# Patient Record
Sex: Male | Born: 1975 | Hispanic: Yes | Marital: Married | Smoking: Never smoker
Health system: Southern US, Community
[De-identification: ages and names within clinical notes are randomized; demographics above are authoritative.]

---

## 2013-09-03 ENCOUNTER — Ambulatory Visit (INDEPENDENT_AMBULATORY_CARE_PROVIDER_SITE_OTHER): Payer: 59 | Admitting: Internal Medicine

## 2013-09-03 ENCOUNTER — Ambulatory Visit: Payer: 59

## 2013-09-03 VITALS — BP 124/90 | HR 80 | Temp 98.1°F | Resp 20 | Ht 68.0 in | Wt 197.0 lb

## 2013-09-03 DIAGNOSIS — R059 Cough, unspecified: Secondary | ICD-10-CM

## 2013-09-03 DIAGNOSIS — R079 Chest pain, unspecified: Secondary | ICD-10-CM

## 2013-09-03 DIAGNOSIS — R05 Cough: Secondary | ICD-10-CM

## 2013-09-03 DIAGNOSIS — J219 Acute bronchiolitis, unspecified: Secondary | ICD-10-CM

## 2013-09-03 DIAGNOSIS — R7309 Other abnormal glucose: Secondary | ICD-10-CM

## 2013-09-03 DIAGNOSIS — J218 Acute bronchiolitis due to other specified organisms: Secondary | ICD-10-CM

## 2013-09-03 DIAGNOSIS — R7302 Impaired glucose tolerance (oral): Secondary | ICD-10-CM

## 2013-09-03 DIAGNOSIS — Z789 Other specified health status: Secondary | ICD-10-CM

## 2013-09-03 LAB — POCT CBC
Granulocyte percent: 53.8 %G (ref 37–80)
HEMATOCRIT: 44.4 % (ref 43.5–53.7)
Hemoglobin: 14.4 g/dL (ref 14.1–18.1)
LYMPH, POC: 1.7 (ref 0.6–3.4)
MCH, POC: 28.5 pg (ref 27–31.2)
MCHC: 32.4 g/dL (ref 31.8–35.4)
MCV: 87.8 fL (ref 80–97)
MID (cbc): 0.9 (ref 0–0.9)
MPV: 9.2 fL (ref 0–99.8)
PLATELET COUNT, POC: 224 10*3/uL (ref 142–424)
POC GRANULOCYTE: 3 (ref 2–6.9)
POC LYMPH %: 30.6 % (ref 10–50)
POC MID %: 15.6 %M — AB (ref 0–12)
RBC: 5.06 M/uL (ref 4.69–6.13)
RDW, POC: 13.4 %
WBC: 5.5 10*3/uL (ref 4.6–10.2)

## 2013-09-03 LAB — POCT GLYCOSYLATED HEMOGLOBIN (HGB A1C): HEMOGLOBIN A1C: 5.6

## 2013-09-03 LAB — GLUCOSE, POCT (MANUAL RESULT ENTRY): POC Glucose: 89 mg/dl (ref 70–99)

## 2013-09-03 MED ORDER — AZITHROMYCIN 500 MG PO TABS: 500.0000 mg | ORAL_TABLET | Freq: Every day | ORAL | Status: DC

## 2013-09-03 MED ORDER — HYDROCODONE-ACETAMINOPHEN 7.5-325 MG/15ML PO SOLN: 5.0000 mL | Freq: Four times a day (QID) | ORAL | Status: DC | PRN

## 2013-09-03 MED ORDER — ALBUTEROL SULFATE HFA 108 (90 BASE) MCG/ACT IN AERS: 2.0000 | INHALATION_SPRAY | Freq: Four times a day (QID) | RESPIRATORY_TRACT | Status: DC | PRN

## 2013-09-03 NOTE — Patient Instructions (Addendum)
Dolor en el pecho (inespecfico) (Chest Pain, Nonspecific) Frecuentemente es difcil dar un diagnstico especfico de la causa de un dolor en el pecho. Siempre existe una posibilidad de que el dolor est relacionado con algo ms grave como un ataque cardaco o un cogulo en los pulmones. Necesitar realizar un seguimiento con el mdico para Catering manager.  CAUSAS  Acidez.  Neumona o bronquitis.  Ansiedad o estrs.  Una inflamacin de la zona que rodea al corazn (pericarditis) o a los pulmones (pleuritis, pleuresa).  Un cogulo sanguneo en el pulmn.  Pulmones colapsados (neumotrax). Puede aparecer de Gus Height repentina por s solo (neumotrax espontneo) o por un traumatismo en el pecho.  Culebrilla (virus del herpes zster). Las paredes del pecho estn compuestas de [place], msculos y Dietitian. Cualquiera de estos puede ser fuente del dolor.  Puede haber una contusin en los huesos debido a una lesin.  Puede haber un esguince en los msculos o el cartlago ocasionado por la tos o un esfuerzo.  El cartlago tambin puede verse afectado por una inflamacin y Engineer, agricultural (costocondritis). DIAGNSTICO Puede ser necesario realizar anlisis de laboratorio u otros estudios tales como radiografas, Materials engineer, prueba de esfuerzo, o diagnstico por imgenes para el corazn para determinar la causa de su dolor.  TRATAMIENTO  El tratamiento depender de la causa que provoque el dolor en el pecho. El tratamiento pueden incluir:  Bloqueadores de cido para la acidez estomacal.  Medicamentos antiinflamatorios.  Analgsicos para las enfermedades inflamatorias.  Antibiticos si existe infeccin.  Podrn aconsejarle que modifique su estilo de vida. Esto incluye dejar de fumar y evitar el alcohol, la cafena y el chocolate.  Se le aconsejar que duerma con la cabeza levantada [place]). Esto reduce la probabilidad de que el cido vuelva hacia atrs desde el estmago  hasta el esfago.  La mayora de las veces, Chief Technology Officer en el pecho no especfico mejora dentro de los 2 a 3 das con reposo y medicamentos para Technical sales engineer. INSTRUCCIONES PARA EL CUIDADO DOMICILIARIO  Si le prescriben antibiticos, tmelos tal como se le indic. Tmelos todos, aunque se sienta mejor.  Durante los [address], evite la actividad fsica que le hace Civil engineer, contracting. Contine con las actividades fsicas tal como se le indic.  No fume.  Evite consumir alcohol.  Slo tome medicamentos de Sales promotion account executive o prescriptos para Primary school teacher, las molestias o bajar la fiebre segn las indicaciones de su mdico.  Siga las indicaciones del profesional que lo asiste para un mayor control si los problemas persisten.  Cumpla con todas las visitas de control programadas. Si no lo hace, podr desarrollar problemas permanentes (crnicos) relacionados con el dolor. Si tiene algn problema para asistir a la cita, debe comunicarse con el establecimiento para obtener asistencia. SOLICITE ATENCIN MDICA SI:  Tiene problemas que considere que pueden ser efectos secundarios de los medicamentos que toma. Lea con cuidado las recomendaciones para su medicacin.  El dolor en el pecho contina incluso despus de haber seguido el tratamiento.  Observa una erupcin en el pecho, que presenta ampollas. SOLICITE ATENCIN MDICA DE INMEDIATO SI:  El dolor en el pecho aumenta o se extiende al brazo, cuello, mandbula, espalda o abdomen.  Le falta el aire, aumenta la tos o tose y [place].  Tiene dolor intenso en la espalda o el abdomen, nuseas o vmitos.  Presenta debilidad, desmayos o escalofros.  Tiene fiebre. ESTO ES UNA EMERGENCIA. No espere a que el dolor se vaya. Pida ayuda mdica de  inmediato. Comunquese con el servicio de urgencias de su localidad (911 en los [place]). No maneje solo [employer]. EST SEGURO QUE:   Comprende las instrucciones para el alta  mdica.  Controlar su enfermedad.  Solicitar atencin mdica de inmediato segn las indicaciones. Document Released: 05/31/2005 Document Revised: 08/23/2011 [facility] Patient Information 2014 [place], [place]. Broncoespasmo - Adulto (Bronchospasm, Adult) Broncoespasmo significa que hay un espasmo o restriccin de las vas areas que llevan el aire a los pulmones. Durante el broncoespasmo, la respiracin se hace ms difcil debido a que las vas respiratorias se contraen. Cuando esto ocurre, puede haber tos, un silbido al respirar (sibilancias) presin en el pecho y dificultad para respirar. Generalmente se asocia al asma, pero no todos los pacientes que experimentan broncoespasmos sufren asma. CAUSAS  La causa del broncoespasmo es la inflamacin o la irritacin de las vas respiratorias. La inflamacin o la irritacin pueden haber sido desencadenadas por:   Environmental consultant (por ejemplo a animales, polen, alimentos y moho). Los alrgenos que causan el broncoespasmo pueden producir sibilancias inmediatamente despus de la exposicin, o varias horas despus.   Infeccin. Se considera que la causa ms frecuente son las infecciones virales.   Ejercicios.   Irritantes (como la polucin, humo de cigarrillos, olores fuertes, Engineer, manufacturing y vapores de [place]).   Los cambios climticos. El viento aumenta la cantidad de moho y polen del aire. La lluvia limpia el aire arrastrando las sustancias irritantes. El aire fro puede causar inflamacin.   Estrs y [employer].  SIGNOS Y SNTOMAS   Sibilancias.   Tos excesiva durante la noche.   Tos frecuente o intensa durante un resfro comn.   Opresin en el pecho.   Falta de aire.  DIAGNSTICO  El broncoespasmo se diagnostica con la historia clnica y un examen fsico. En algunos casos se indican otros estudios, como radiografas de trax, para Risk manager. TRATAMIENTO   Le indicarn medicamentos por va inhalatoria  para abrir las vas respiratorias y ayudarlo a Industrial/product designer. Los medicamentos se administran por medio de Advertising account executive o Warehouse manager.  Le administrarn corticoides en los casos de broncoespasmo grave, generalmente cuando se asocia al asma. INSTRUCCIONES PARA EL CUIDADO EN EL HOGAR   Siempre tenga un plan preparado para pedir asistencia mdica. Sepa cuando debe llamar al mdico y a los servicios de emergencia de su localidad (911 en [place]). Sepa donde puede acceder a un servicio de emergencias.  Tome todos los medicamentos como le indic el mdico.  Si le indicaron el uso de un inhalador o nebulizador, consulte a su mdico para que le explique cmo usarlo correctamente. Siempre use un espaciador con el inhalador, si le indicaron su uso.  Es Copywriter, advertising muy tranquilo durante el ataque. Trate de relajarse y respirar ms lentamente.  Controle el ambiente del hogar del siguiente modo:  Cambie el filtro de la calefaccin y del aire acondicionado al menos una vez al mes.   Limite el uso de hogares o estufas a lea.  No fume y nopermita que fumen en su hogar.   Evite la exposicin a perfumes y fragancias.   Elimine las plagas (como cucarachas, ratones) y sus excrementos.   Elimine las plantas si observa moho en ellas.   Mantenga su casa limpia y [place].   Reemplace las alfombras por pisos de [place], baldosas o vinilo. Las alfombras pueden retener las escamas o pelos de los animales y [employer].   Use almohadas, mantas y cubre colchones antialrgicos.  Lave las sbanas y las mantas todas las semanas con agua caliente y squelas con aire caliente.   Use mantas de [place] o algodn.   Lvese las manos con frecuencia. SOLICITE ATENCIN MDICA SI:   Tiene dolores musculares.   Siente dolor en el pecho.   El esputo cambia de un color claro o blanco a un color amarillo, verde, gris o sanguinolento.   El esputo que elimina es ms espeso.   Tiene  problemas relacionados con el medicamento que recibe. como urticaria, picazn, hinchazn o dificultades respiratorias.  SOLICITE ATENCIN MDICA DE INMEDIATO SI:   Empeoran las sibilancias y la tos, an despus de tomar los medicamentos recetados.   Tiene una creciente dificultad para respirar.   Siente un dolor intenso en el pecho. ASEGRESE DE QUE:   Comprende estas instrucciones.  Controlar su afeccin.  Recibir ayuda de inmediato si no mejora o si empeora. Document Released: 09/07/2007 Document Revised: 01/31/2013 [facility] Patient Information 2014 [place], [place]. Bronquitis (Bronchitis) La bronquitis es una inflamacin de las vas respiratorias que se extienden desde la trquea [employer] pulmones (bronquios). A menudo, la inflamacin produce la formacin de mucosidad, lo que genera tos. Si la inflamacin es grave, puede provocar falta de aire. CAUSAS  Las causas de la bronquitis pueden ser:   Infecciones virales.  Bacterias.  Humo del cigarrillo.  Alrgenos, contaminantes y otros irritantes. SIGNOS Y SNTOMAS  El sntoma ms habitual de la bronquitis es la tos frecuente con mucosidad. Otros sntomas son:  Grant Ruts.  Dolores [employer] cuerpo.  Congestin en el pecho.  Escalofros.  Falta de aire.  Dolor de Advertising copywriter. DIAGNSTICO  La bronquitis en general se diagnostica con la historia clnica y un examen fsico. En algunos casos se indican otros estudios, como radiografas, para Risk manager.  TRATAMIENTO  Tal vez deba evitar el contacto con la causa del problema (por ejemplo, el cigarrillo). En algunos casos es Dentist. Estos pueden ser:  Antibiticos. Tal vez se los receten si la causa de la bronquitis es una bacteria.  Antitusivos. Tal vez se los receten para [employer] sntomas de la tos.  Medicamentos inhalados. Tal vez se los receten para Museum/gallery conservator las vas respiratorias y Research officer, political party respiracin.  Medicamentos  con corticoides. Tal vez se los receten si tiene bronquitis recurrente (crnica). INSTRUCCIONES PARA EL CUIDADO EN EL HOGAR  Descanse lo suficiente.  Beba lquidos en abundancia para mantener la orina de color claro o amarillo plido (excepto que padezca una enfermedad que requiera la restriccin de lquidos). Tome mucho lquido para Restaurant manager, fast food las secreciones y Statistician.  Tome solo medicamentos de venta libre o recetados, segn las indicaciones del mdico.  Tome los antibiticos exclusivamente segn las indicaciones. Finalice la prescripcin completa, aunque se sienta mejor.  Evite el humo de [address], los qumicos irritantes y los vapores fuertes. Estos agentes empeoran la bronquitis. Si es fumador, abandone el hbito. Considere el uso de goma de Theatre manager o la aplicacin de parches en la piel que contengan nicotina para aliviar los sntomas de abstinencia. Si deja de fumar, sus pulmones se curarn ms rpido.  Ponga un humidificador de vapor fro en la habitacin por la noche para humedecer el aire. Puede ayudarlo a aflojar la mucosidad. Cambie el agua del humidificador a diario. Tambin puede abrir el agua caliente de la ducha y sentarse en el bao con la puerta cerrada durante 5a42minutos.  Concurra a las consultas de control con su mdico segn las indicaciones.  Lvese  las manos con frecuencia para evitar contagiarse bronquitis nuevamente y para no extender la infeccin a Economistotras personas. SOLICITE ATENCIN MDICA SI: Los sntomas no mejoran despus de 1 semana de tratamiento.  SOLICITE ATENCIN MDICA DE INMEDIATO SI:  La fiebre aumenta.  Tiene escalofros.  Siente dolor en el pecho.  Le empeora la falta el aire.  La flema tiene Liberalsangre.  Se desmaya.  Tiene vahdos.  Sufre un dolor intenso de [place].  Vomita repetidas veces. ASEGRESE DE QUE:   Comprende estas instrucciones.  Controlar su afeccin.  Recibir ayuda de inmediato si no mejora o si  empeora. Document Released: 05/31/2005 Document Revised: 03/21/2013 [facility] Patient Information 2014 [place].

## 2013-09-03 NOTE — Progress Notes (Signed)
   Subjective:    Patient ID: Tommy PoreGregorio Rosario, male    DOB: Nov 07, 1975, 38 y.o.   MRN: [id number]  HPI Patient presents today with cough that started yesterday. His cough has been so strong it makes his chest hurt and the pain radiates down his left arm. He states he also feels the pain in his back on the left side. He has not coughed up any mucus. He has fatigue and SOB. His sinuses seem fine. He has sore throat and he hears wheezing when he lays down.   He is on metformin for glucose intolerance. Chest pain not assoc. With syncope, palpitations, or diaphoresis. No hx of asthma but he hears noises in his chest. No hx of smoking. Review of Systems     Objective:   Physical Exam  Constitutional: He is oriented to person, place, and time. He appears well-developed and well-nourished. No distress.  HENT:  Head: Normocephalic.  Right Ear: External ear normal.  Left Ear: External ear normal.  Nose: Mucosal edema, rhinorrhea and sinus tenderness present.  Mouth/Throat: Oropharynx is clear and moist.  Eyes: EOM are normal. No scleral icterus.  Neck: Normal range of motion. Neck supple. No thyromegaly present.  Cardiovascular: Normal rate, regular rhythm and normal heart sounds.   Pulmonary/Chest: Effort normal. He has wheezes. He has rhonchi. He exhibits tenderness.  Lymphadenopathy:    He has no cervical adenopathy.  Neurological: He is alert and oriented to person, place, and time. He exhibits normal muscle tone. Coordination normal.  Psychiatric: He has a normal mood and affect.   EKG normal  [facility] reading (PRIMARY) by  Dr.Hosanna Betley. Increased markings, no def. infiltrate  Results for orders placed in visit on 09/03/13  GLUCOSE, POCT (MANUAL RESULT ENTRY)      Result Value Ref Range   POC Glucose 89  70 - 99 mg/dl  POCT CBC      Result Value Ref Range   WBC 5.5  4.6 - 10.2 K/uL   Lymph, poc 1.7  0.6 - 3.4   POC LYMPH PERCENT 30.6  10 - 50 %L   MID (cbc) 0.9  0 - 0.9   POC MID %  15.6 (*) 0 - 12 %M   POC Granulocyte 3.0  2 - 6.9   Granulocyte percent 53.8  37 - 80 %G   RBC 5.06  4.69 - 6.13 M/uL   Hemoglobin 14.4  14.1 - 18.1 g/dL   HCT, POC 62.144.4  30.843.5 - 53.7 %   MCV 87.8  80 - 97 fL   MCH, POC 28.5  27 - 31.2 pg   MCHC 32.4  31.8 - 35.4 g/dL   RDW, POC 65.713.4     Platelet Count, POC 224  142 - 424 K/uL   MPV 9.2  0 - 99.8 fL  POCT GLYCOSYLATED HEMOGLOBIN (HGB A1C)      Result Value Ref Range   Hemoglobin A1C 5.6            Assessment & Plan:  Chest pain/Cough/Bronchospasm NIDDM controlled Zithromax 500mg /Albuterol inhaler/Lortab elixir

## 2015-08-30 ENCOUNTER — Ambulatory Visit (INDEPENDENT_AMBULATORY_CARE_PROVIDER_SITE_OTHER): Payer: 59 | Admitting: Family Medicine

## 2015-08-30 ENCOUNTER — Ambulatory Visit (INDEPENDENT_AMBULATORY_CARE_PROVIDER_SITE_OTHER): Payer: 59

## 2015-08-30 VITALS — BP 140/82 | HR 75 | Temp 98.4°F | Resp 20 | Ht 68.5 in | Wt 210.0 lb

## 2015-08-30 DIAGNOSIS — M545 Low back pain: Secondary | ICD-10-CM

## 2015-08-30 DIAGNOSIS — N50812 Left testicular pain: Secondary | ICD-10-CM

## 2015-08-30 DIAGNOSIS — E119 Type 2 diabetes mellitus without complications: Secondary | ICD-10-CM

## 2015-08-30 DIAGNOSIS — K219 Gastro-esophageal reflux disease without esophagitis: Secondary | ICD-10-CM | POA: Diagnosis not present

## 2015-08-30 DIAGNOSIS — Z789 Other specified health status: Secondary | ICD-10-CM

## 2015-08-30 LAB — POCT URINALYSIS DIP (MANUAL ENTRY)
BILIRUBIN UA: NEGATIVE
BILIRUBIN UA: NEGATIVE
GLUCOSE UA: NEGATIVE
Leukocytes, UA: NEGATIVE
NITRITE UA: NEGATIVE
PH UA: 6.5
Protein Ur, POC: NEGATIVE
RBC UA: NEGATIVE
SPEC GRAV UA: 1.02
Urobilinogen, UA: 0.2

## 2015-08-30 LAB — POC MICROSCOPIC URINALYSIS (UMFC): Mucus: ABSENT

## 2015-08-30 LAB — POCT GLYCOSYLATED HEMOGLOBIN (HGB A1C): HEMOGLOBIN A1C: 6

## 2015-08-30 LAB — GLUCOSE, POCT (MANUAL RESULT ENTRY): POC GLUCOSE: 99 mg/dL (ref 70–99)

## 2015-08-30 MED ORDER — CIPROFLOXACIN HCL 500 MG PO TABS: 500.0000 mg | ORAL_TABLET | Freq: Two times a day (BID) | ORAL | Status: DC

## 2015-08-30 MED ORDER — CYCLOBENZAPRINE HCL 5 MG PO TABS: ORAL_TABLET | ORAL | Status: DC

## 2015-08-30 NOTE — Progress Notes (Addendum)
Subjective:    Patient ID: Tommy Rosario, male    DOB: Feb 25, 1976, 40 y.o.   MRN: [id number] By signing my name below, I, Javier Docker, attest that this documentation has been prepared under the direction and in the presence of Meredith Staggers, MD. Electronically Signed: Javier Docker, ER Scribe. 08/30/2015. 11:29 AM.  Chief Complaint  Patient presents with  . Testicle Pain    had cyst testicle 6 year ago, feels pain left side x 2 week   . Back Pain    lower back side x 2 week  . Other    sugar level check out  . Gastroesophageal Reflux    HPI HPI Comments: Tommy Rosario is a 40 y.o. male who presents to [facility] reporting for multiple concerns. He usually exercises every night, but has not been able to exercise recently due to his back pain.   Testicle pain: Left sided testicle pain for the last two weeks. Reports having had cysts on the testicles six years ago. He denies dysuria or penile discharge. He was not hit or kicked, the pain started spontaneously. He has no new sexual partners. Right testicle is normal. He has not seen a specialist for his testicular sx.   Low back pain:  Left low back pain for the last two weeks that radiates down his left side. He has no past hx of kidney stones. He works in a Clinical cytogeneticist in   GERD: He has had some heart burn for the last six months. When he eats greasy foods he has heart burn. He has never used a medication for his sx.   DM: He has taken medications for diabetes in the past. He lost weight and has not had to take medications.   Lab Results  Component Value Date   HGBA1C 5.6 09/03/2013   Wt Readings from Last 3 Encounters:  08/30/15 210 lb (95.255 kg)  09/03/13 197 lb (89.359 kg)    There are no active problems to display for this patient.  No past medical history on file. No past surgical history on file. No Known Allergies Prior to Admission medications   Not on File   Social History   Social  History  . Marital Status: Married    Spouse Name: N/A  . Number of Children: N/A  . Years of Education: N/A   Occupational History  . Not on file.   Social History Main Topics  . Smoking status: Never Smoker   . Smokeless tobacco: Not on file  . Alcohol Use: No  . Drug Use: No  . Sexual Activity: Not on file   Other Topics Concern  . Not on file   Social History Narrative    Review of Systems  Constitutional: Negative for fever and chills.  Genitourinary: Negative for dysuria and discharge.  Musculoskeletal: Positive for back pain. Negative for gait problem.      Objective:  BP 140/82 mmHg  Pulse 75  Temp(Src) 98.4 F (36.9 C) (Oral)  Resp 20  Ht 5' 8.5" (1.74 m)  Wt 210 lb (95.255 kg)  BMI 31.46 kg/m2  SpO2 97%  Physical Exam  Constitutional: He is oriented to person, place, and time. He appears well-developed and well-nourished. No distress.  HENT:  Head: Normocephalic and atraumatic.  Eyes: Pupils are equal, round, and reactive to light.  Neck: Neck supple.  Cardiovascular: Normal rate, regular rhythm and normal heart sounds.   No murmur heard. Pulmonary/Chest: Effort normal and breath sounds normal.  No respiratory distress. He has no wheezes.  Abdominal:  No CVA tenderness.   Genitourinary:  He has some tenderness in the front of the left testicle. No hernia. No discharge from penis. Not circumcised.   Musculoskeletal: Normal range of motion. He exhibits tenderness.  Seated straight leg raise normal. Strength normal in lower extremities. Some tenderness along the left paraspinal muscles.   Neurological: He is alert and oriented to person, place, and time. Coordination normal.  Negative babinski. Reflexes 2+ in patella and achilles.   Skin: Skin is warm and dry. He is not diaphoretic.  Psychiatric: He has a normal mood and affect. His behavior is normal.  Nursing note and vitals reviewed.   Dg Abd 1 View  08/30/2015  CLINICAL DATA:  Abdominal pain x2  weeks EXAM: ABDOMEN - 1 VIEW COMPARISON:  None. FINDINGS: Normal bowel gas pattern. No abnormal abdominal calcifications. Regional bones unremarkable. IMPRESSION: Negative. Electronically Signed   By: Corlis Leak M.D.   On: 08/30/2015 12:20   Results for orders placed or performed in visit on 08/30/15  POCT glucose (manual entry)  Result Value Ref Range   POC Glucose 99 70 - 99 mg/dl  POCT glycosylated hemoglobin (Hb A1C)  Result Value Ref Range   Hemoglobin A1C 6.0   POCT urinalysis dipstick  Result Value Ref Range   Color, UA yellow yellow   Clarity, UA clear clear   Glucose, UA negative negative   Bilirubin, UA negative negative   Ketones, POC UA negative negative   Spec Grav, UA 1.020    Blood, UA negative negative   pH, UA 6.5    Protein Ur, POC negative negative   Urobilinogen, UA 0.2    Nitrite, UA Negative Negative   Leukocytes, UA Negative Negative  POCT Microscopic Urinalysis ([facility])  Result Value Ref Range   WBC,UR,HPF,POC None None WBC/hpf   RBC,UR,HPF,POC None None RBC/hpf   Bacteria None None, Too numerous to count   Mucus Absent Absent   Epithelial Cells, UR Per Microscopy None None, Too numerous to count cells/hpf       Assessment & Plan:   Tommy Rosario is a 40 y.o. male Left low back pain, with sciatica presence unspecified - Plan: DG Abd 1 View, cyclobenzaprine (FLEXERIL) 5 MG tablet  -Mechanical/degenerative disc disease possible. No red flags on exam/history. Imaging deferred. Trial of NSAID, range of motion, and Flexeril if needed. Side effects discussed. Return to clinic if not improving as listed patient instructions. Sooner if worse  Left testicular pain - Plan: US Scrotum, POCT urinalysis dipstick, POCT Microscopic Urinalysis ([facility]), DG Abd 1 View, ciprofloxacin (CIPRO) 500 MG tablet  -Reported history of cyst, but suspicious for epididymoorchitis. Start Cipro 500 mg twice a day, check scrotal ultrasound, and if symptomatic cyst, will plan on  referring to urology for evaluation. RTC precautions if worsening. Tendon risks discussed with Cipro.  Type 2 diabetes, diet controlled (HCC) - Plan: POCT glucose (manual entry), POCT glycosylated hemoglobin (Hb A1C)  - Continue to watch diet, exercise, ideal goal of A1c less than 5.7.  Gastroesophageal reflux disease, esophagitis presence not specified  -Trigger avoidance discussed, then Zantac or Pepcid if possible. If not improved with these, can try over-the-counter omeprazole, but discussed side effects and long-term risks.  Language barrier  -Spanish spoken, understanding expressed.  Meds ordered this encounter  Medications  . ciprofloxacin (CIPRO) 500 MG tablet    Sig: Take 1 tablet (500 mg total) by mouth 2 (two) times daily.  Dispense:  20 tablet    Refill:  0  . cyclobenzaprine (FLEXERIL) 5 MG tablet    Sig: 1 pill by mouth up to every 8 hours as needed. Start with one pill by mouth each bedtime as needed due to sedation    Dispense:  15 tablet    Refill:  0   Patient Instructions       IF you received an x-ray today, you will receive an invoice from [facility]. Please contact [facility] at [phone] with questions or concerns regarding your invoice.   IF you received labwork today, you will receive an invoice from [employer]. Please contact [facility] at [phone] with questions or concerns regarding your invoice.   Our billing staff will not be able to assist you with questions regarding bills from these companies.  You will be contacted with the lab results as soon as they are available. The fastest way to get your results is to activate your My Chart account. Instructions are located on the last page of this paperwork. If you have not heard from [place] regarding the results in 2 weeks, please contact this office.    cipro por posible infeccion en testiculo. voy a llamarle de informacion de ultrasonografia.  Tylenol o  motrin por su espalda y si necesario - flexeril en la noche.  regrese en una seman si no esta mejor. Mas temprano si empeorse.   Zantac si necesario oor acido,  pero primero  No come comida que causa simptomas: Dolor de Armed forces technical officer (Back Pain, Adult) El dolor de espalda es muy frecuente en los adultos.La causa del dolor de espalda es rara vez peligrosa y Chief Technology Officer a menudo mejora con el [place].Es posible que se desconozca la causa de esta afeccin. Algunas causas comunes son las siguientes:  Distensin de los msculos o ligamentos que sostienen la columna vertebral.  Chiropractor (degeneracin) de los discos vertebrales.  Artritis.  Lesiones directas en la espalda. En Yahoo, el dolor de espalda es recurrente. Como rara vez es peligroso, las personas pueden aprender a Psychologist, clinical afeccin por s mismas. INSTRUCCIONES PARA EL CUIDADO EN EL HOGAR Controle su dolor de espalda a fin de Public house manager cambio. Las siguientes indicaciones ayudarn a Architectural technologist que pueda sentir:  Medical illustrator. Si permanece sentado o de pie en un mismo lugar durante mucho tiempo, se tensiona la espalda. No se siente, conduzca o permanezca de pie en un mismo lugar durante ms de 30 minutos seguidos. Realice caminatas cortas en superficies planas tan pronto como le sea posible.Trate de caminar un poco ms de Pharmacist, community.  Haga ejercicio regularmente como se lo haya indicado el mdico. El ejercicio ayuda a que su espalda se cure ms rpidamente. Tambin ayuda a prevenir futuras lesiones al [employer] fuertes y flexibles.  No permanezca en la cama.Si hace reposo ms de 1 a 2 das, puede demorar su recuperacin.  Preste atencin a su cuerpo al inclinarse y levantarse. Las posiciones ms cmodas son las que ejercen menos tensin en la espalda en recuperacin. Siempre use tcnicas apropiadas para levantar objetos, como por ejemplo:  Flexionar las rodillas.  Mantener la  carga cerca del cuerpo.  No torcerse.  Encuentre una posicin cmoda para dormir. Use un colchn firme y recustese de costado con las rodillas ligeramente flexionadas. Si se recuesta Fisher [employer], coloque una almohada debajo de las rodillas.  Evite sentir ansiedad o estrs.El estrs aumenta la tensin muscular y  puede empeorar el dolor de espalda.Es importante reconocer si se siente ansioso o estresado y aprender maneras de controlarlo, por ejemplo haciendo ejercicio.  Tome los medicamentos solamente como se lo haya indicado el mdico. Los medicamentos de venta libre para Engineer, materials y la inflamacin a menudo son los ms eficaces.El mdico puede recetarle relajantes musculares.Estos medicamentos ayudan a Primary school teacher de modo que pueda reanudar ms rpidamente sus actividades normales y el ejercicio saludable.  Aplique hielo sobre la zona lesionada.  Ponga el hielo en una bolsa plstica.  Coloque una toalla entre la piel y la bolsa de hielo.  Deje el hielo durante , 2 a 3veces por da, durante los primeros 2 o 3das. Despus de eso, puede alternar el hielo y el calor para reducir Chief Technology Officer y los espasmos.  Mantenga un peso saludable. El exceso de peso ejerce presin adicional sobre la espalda y hace que resulte difcil mantener una buena [place]. SOLICITE ATENCIN MDICA SI:  Siente un dolor que no se alivia con reposo o medicamentos.  Siente mucho dolor que se extiende a las piernas o los glteos.  El dolor no mejora en una semana.  Siente dolor por la noche.  Pierde peso.  Siente escalofros o fiebre. SOLICITE ATENCIN MDICA DE INMEDIATO SI:   Tiene nuevos problemas para controlar la vejiga o los intestinos.  Siente debilidad o adormecimiento inusuales en los brazos o en las piernas.  Siente nuseas o vmitos.  Siente dolor abdominal.  Siente que va a desmayarse.   Esta informacin no tiene Theme park manager el consejo del mdico. Asegrese  de hacerle al mdico cualquier pregunta que tenga.   Document Released: 05/31/2005 Document Revised: 06/21/2014 Elsevier Interactive Patient Education 2016 [employer].   Epididimitis (Epididymitis) La epididimitis es la hinchazn (inflamacin) del epiddimo. El epiddimo es [place] estructura similar a una cuerda que se encuentra a lo largo de la regin posterosuperior del testculo. Recolecta y almacena el semen del testculo. Esta afeccin tambin puede causar dolor e hinchazn en el testculo y el escroto. Generalmente, los sntomas comienzan de [place] repentina (epididimitis [place]). A veces, la epididimitis empieza de manera gradual y dura algn tiempo (epididimitis crnica). Este tipo puede ser ms difcil de tratar. CAUSAS En los hombres menores de 35aos, esta afeccin suele deberse a una infeccin bacteriana o a una enfermedad de transmisin sexual (ETS), por ejemplo:  Gonorrea.  Clamidia.  En los hombres L[employer]3 Communications de 35aos que no tienen sexo anal, la causa de esta afeccin suelen ser las bacterias de una obstruccin o las anomalas en el aparato urinario. Estas pueden ser consecuencia de lo siguiente:  Winferd Humphrey sonda en la vejiga (sonda urinaria).  Tener la prstata agrandada o inflamada.  Haberse sometido recientemente a [place] de las vas [place]. En los hombres que padecen una enfermedad que debilita el sistema de defensa (sistema inmunitario) del organismo, como el VIH, las causas de esta afeccin pueden ser las siguientes:   Otras bacterias, incluidas la tuberculosis y la sfilis.  Virus.  Hongos. En ocasiones, esta afeccin se presenta sin infeccin. Eso puede ocurrir si la orina retrocede al epiddimo despus de haber levantado un objeto pesado o de haber hecho un esfuerzo. FACTORES DE RIESGO Es ms probable que esta afeccin se manifieste en los hombres que:  Han tenido relaciones sexuales sin proteccin con ms de Medical laboratory scientific officer.  Tienen sexo anal.   Se  sometieron recientemente a [place].   Tienen una sonda urinaria.  Tienen problemas urinarios.  Estn inmunodeprimidos. SNTOMAS  Generalmente, esta afeccin se manifiesta sbitamente con escalofros, fiebre, dolor detrs del escroto y en el testculo. Otros sntomas pueden ser los siguientes:   Hinchazn del escroto, el testculo o ambos.  Dolor al eyacular o al Beatrix Shipper.  Dolor en la espalda o el vientre.  Nuseas.  Picazn y secrecin por el pene.  Necesidad frecuente de Geographical information systems officer.  Enrojecimiento y Engineer, mining a Radiation protection practitioner. DIAGNSTICO El mdico puede diagnosticar esta afeccin en funcin de los sntomas y la historia clnica. El mdico Location manager un examen fsico para hacerle preguntas sobre los sntomas y revisarle tanto el escroto como los testculos a fin de Engineer, manufacturing signos de hinchazn, dolor y enrojecimiento. Tambin pueden hacerle otros estudios, por ejemplo:   Anlisis de la secrecin que emana del pene.  Anlisis de orina para detectar infecciones, como las ETS.  El mdico puede hacerle pruebas de deteccin de otras enfermedades de transmisin sexual (ETS), incluido el VIH. TRATAMIENTO El tratamiento de esta afeccin depende de la causa. Si la afeccin se debe a una infeccin bacteriana, pueden recetarle antibiticos por va oral. Si la infeccin bacteriana se ha diseminado a la [place], tal vez deba recibir antibiticos por va intravenosa. El tratamiento de la epididimitis no bacteriana es el cuidado en el hogar, que incluye reposo en cama y elevacin del escroto. Es posible que necesite una ciruga para tratar lo siguiente:  La epididimitis bacteriana que produce la acumulacin de pus en el escroto (absceso).  La epididimitis crnica que no ha respondido a otros tratamientos. INSTRUCCIONES PARA EL CUIDADO EN EL HOGAR Medicamentos  [employer] de venta libre y los recetados solamente como se lo haya indicado el mdico.   Si  le recetaron un antibitico, tmelo como se lo haya indicado el mdico. No deje de tomar o usar el antibitico aunque la afeccin mejore. Actividad sexual  Si la causa de la epididimitis es una ETS, evite la actividad sexual hasta haber completado el [place].  Si el resultado del estudio de una ETS fue positivo, informe a sus Chief Strategy Officer. Tal vez deban recibir tratamiento.No tenga actividad sexual con sus parejas sexuales hasta haber completado el tratamiento. Instrucciones generales  Reanude sus actividades normales como se lo haya indicado el mdico. Pregntele al mdico qu actividades son seguras para usted.  Mantenga el escroto elevado y bien sujeto mientras hace reposo. Pregntele al mdico si debe usar un dispositivo de sujecin del escroto, como un suspensorio. selo como se lo haya indicado el mdico.  Si se lo indican, aplique hielo en la zona afectada:   Ponga el hielo en una bolsa plstica.  Coloque una toalla entre la piel y la bolsa de hielo.  Coloque el hielo durante , 2 a 3veces por Futures trader.  Intente darse un bao de asiento para [employer]. Se trata de un bao de agua tibia que se toma mientras se est sentado. El agua solo debe Adult nurse las caderas y cubrir las nalgas. Hgalo 3 o 4veces al da o como se lo haya indicado el mdico.  Concurra a todas las visitas de control como se lo haya indicado el mdico. Esto es importante. SOLICITE ATENCIN MDICA SI:   Lance Muss.   El medicamento no IT trainer.   El dolor [place].   Los sntomas no mejoran en el trmino de [employer].   Esta informacin no tiene Theme park manager el consejo del mdico. Asegrese de hacerle al mdico cualquier pregunta  que tenga.   Document Released: 05/31/2005 Document Revised: 02/19/2015 Elsevier Interactive Patient Education 2016 ArvinMeritorElsevier Inc.    Opciones de alimentos para pacientes con reflujo gastroesofgico - Adultos (Food Choices  for Gastroesophageal Reflux Disease, Adult) Cuando se tiene reflujo gastroesofgico (ERGE), los alimentos que se ingieren y los hbitos de alimentacin son Engineer, productionmuy importantes. Elegir los alimentos adecuados puede ayudar a Paramedicaliviar las molestias ocasionadas por el [place]onERGE. QU PAUTAS GENERALES DEBO SEGUIR?  Elija las frutas, los vegetales, los cereales integrales, los productos lcteos, la carne de [place], de pescado y de ave con bajo contenido de grasas.  Limite las grasas, [address] Lanecomo los Burnsaceites, los aderezos para [place]anc[place]ada, la [place], los frutos secos y Programme researcher, broadcasting/film/videoel aguacate.  Lleve un registro de las comidas para identificar los alimentos que ocasionan sntomas.  Evite los alimentos que le ocasionen reflujo. Pueden ser distintos para cada persona.  Haga comidas pequeas con frecuencia en lugar de tres comidas OfficeMax Incorporatedabundantes todos los das.  Coma lentamente, en un clima distendido.  Limite el consumo de alimentos fritos.  Cocine los alimentos utilizando mtodos que no sean la fritura.  Evite el consumo alcohol.  Evite beber grandes cantidades de lquidos con las comidas.  Evite agacharse o recostarse hasta despus de 2 o 3horas de haber comido. QU ALIMENTOS NO SE RECOMIENDAN? Los siguientes son algunos alimentos y bebidas que pueden empeorar los sntomas: Veterinary surgeonVegetales Tomates. Jugo de tomate. Salsa de tomate y espagueti. Ajes. [place] y Ridge Farmajo. Rbano picante. Frutas Naranjas, pomelos y limn (fruta y [place]jugo). Carnes Carnes de [place], de pescado y de ave con gran contenido de grasas. Esto incluye los perros calientes, las Groesbeckcostillas, el Victorjamn, la salchicha, el salame y el tocino. Lcteos Leche entera y Wedgewoodleche chocolatada. PPG IndustriesCrema cida. Crema. Mantequilla. Helados. Queso crema.  Bebidas Caf y t negro, con o sin cafena Bebidas gaseosas o energizantes. Condimentos Salsa picante. Salsa barbacoa.  Dulces/postres Chocolate y cacao. Rosquillas. Menta y mentol. Grasas y Massachusetts Mutual Lifeaceites Alimentos con alto contenido de  grasas, incluidas las papas fritas. Otros Vinagre. Especias picantes, como la Brink's Companypimienta negra, la pimienta blanca, la pimienta roja, la pimienta de cayena, el curry en Inglesidepolvo, los clavos de [place]olor, el jengibre y el [place] en polvo. Los artculos mencionados arriba pueden no ser Raytheonuna lista completa de las bebidas y los alimentos que se Theatre stage managerdeben evitar. Comunquese con el nutricionista para recibir ms informacin.   Esta informacin no tiene Theme park managercomo fin reemplazar el consejo del mdico. Asegrese de hacerle al mdico cualquier pregunta que tenga.   Document Released: 03/10/2005 Document Revised: 06/21/2014 Elsevier Interactive Patient Education Yahoo[employer] Inc2016 Elsevier Inc.      I personally performed the services described in this documentation, which was scribed in my presence. The recorded information has been reviewed and considered, and addended by me as needed.

## 2015-08-30 NOTE — Patient Instructions (Addendum)
IF you received an x-ray today, you will receive an invoice from [facility]. Please contact [facility] at [phone] with questions or concerns regarding your invoice.   IF you received labwork today, you will receive an invoice from [employer]. Please contact [facility] at [phone] with questions or concerns regarding your invoice.   Our billing staff will not be able to assist you with questions regarding bills from these companies.  You will be contacted with the lab results as soon as they are available. The fastest way to get your results is to activate your My Chart account. Instructions are located on the last page of this paperwork. If you have not heard from [place] regarding the results in 2 weeks, please contact this office.    cipro por posible infeccion en testiculo. voy a llamarle de informacion de ultrasonografia.  Tylenol o motrin por su espalda y si necesario - flexeril en la noche.  regrese en una seman si no esta mejor. Mas temprano si empeorse.   Zantac si necesario oor acido,  pero primero  No come comida que causa simptomas: Dolor de Armed forces technical officer (Back Pain, Adult) El dolor de espalda es muy frecuente en los adultos.La causa del dolor de espalda es rara vez peligrosa y Chief Technology Officer a menudo mejora con el [place].Es posible que se desconozca la causa de esta afeccin. Algunas causas comunes son las siguientes:  Distensin de los msculos o ligamentos que sostienen la columna vertebral.  Chiropractor (degeneracin) de los discos vertebrales.  Artritis.  Lesiones directas en la espalda. En Yahoo, el dolor de espalda es recurrente. Como rara vez es peligroso, las personas pueden aprender a Psychologist, clinical afeccin por s mismas. INSTRUCCIONES PARA EL CUIDADO EN EL HOGAR Controle su dolor de espalda a fin de Public house manager cambio. Las siguientes indicaciones ayudarn a Architectural technologist que pueda  sentir:  Medical illustrator. Si permanece sentado o de pie en un mismo lugar durante mucho tiempo, se tensiona la espalda. No se siente, conduzca o permanezca de pie en un mismo lugar durante ms de 30 minutos seguidos. Realice caminatas cortas en superficies planas tan pronto como le sea posible.Trate de caminar un poco ms de Pharmacist, community.  Haga ejercicio regularmente como se lo haya indicado el mdico. El ejercicio ayuda a que su espalda se cure ms rpidamente. Tambin ayuda a prevenir futuras lesiones al [employer] fuertes y flexibles.  No permanezca en la cama.Si hace reposo ms de 1 a 2 das, puede demorar su recuperacin.  Preste atencin a su cuerpo al inclinarse y levantarse. Las posiciones ms cmodas son las que ejercen menos tensin en la espalda en recuperacin. Siempre use tcnicas apropiadas para levantar objetos, como por ejemplo:  Flexionar las rodillas.  Mantener la carga cerca del cuerpo.  No torcerse.  Encuentre una posicin cmoda para dormir. Use un colchn firme y recustese de costado con las rodillas ligeramente flexionadas. Si se recuesta Fisher Scientific, coloque una almohada debajo de las rodillas.  Evite sentir ansiedad o estrs.El estrs aumenta la tensin muscular y puede empeorar el dolor de espalda.Es importante reconocer si se siente ansioso o estresado y aprender maneras de controlarlo, por ejemplo haciendo ejercicio.  Tome los medicamentos solamente como se lo haya indicado el mdico. Los medicamentos de venta libre para Engineer, materials y la inflamacin a menudo son los ms eficaces.El mdico puede recetarle relajantes musculares.Estos medicamentos ayudan a Primary school teacher de [place]  que pueda reanudar ms rpidamente sus actividades normales y el ejercicio saludable.  Aplique hielo sobre la zona lesionada.  Ponga el hielo en una bolsa plstica.  Coloque una toalla entre la piel y la bolsa de hielo.  Deje el hielo durante , 2  a 3veces por da, durante los primeros 2 o 3das. Despus de eso, puede alternar el hielo y el calor para reducir Chief Technology Officer y los espasmos.  Mantenga un peso saludable. El exceso de peso ejerce presin adicional sobre la espalda y hace que resulte difcil mantener una buena [place]. SOLICITE ATENCIN MDICA SI:  Siente un dolor que no se alivia con reposo o medicamentos.  Siente mucho dolor que se extiende a las piernas o los glteos.  El dolor no mejora en una semana.  Siente dolor por la noche.  Pierde peso.  Siente escalofros o fiebre. SOLICITE ATENCIN MDICA DE INMEDIATO SI:   Tiene nuevos problemas para controlar la vejiga o los intestinos.  Siente debilidad o adormecimiento inusuales en los brazos o en las piernas.  Siente nuseas o vmitos.  Siente dolor abdominal.  Siente que va a desmayarse.   Esta informacin no tiene Theme park manager el consejo del mdico. Asegrese de hacerle al mdico cualquier pregunta que tenga.   Document Released: 05/31/2005 Document Revised: 06/21/2014 Elsevier Interactive Patient Education 2016 [employer].   Epididimitis (Epididymitis) La epididimitis es la hinchazn (inflamacin) del epiddimo. El epiddimo es [place] estructura similar a una cuerda que se encuentra a lo largo de la regin posterosuperior del testculo. Recolecta y almacena el semen del testculo. Esta afeccin tambin puede causar dolor e hinchazn en el testculo y el escroto. Generalmente, los sntomas comienzan de [place] repentina (epididimitis [place]). A veces, la epididimitis empieza de manera gradual y dura algn tiempo (epididimitis crnica). Este tipo puede ser ms difcil de tratar. CAUSAS En los hombres menores de 35aos, esta afeccin suele deberse a una infeccin bacteriana o a una enfermedad de transmisin sexual (ETS), por ejemplo:  Gonorrea.  Clamidia.  En los hombres L[employer]3 Communications de 35aos que no tienen sexo anal, la causa de esta afeccin suelen ser  las bacterias de una obstruccin o las anomalas en el aparato urinario. Estas pueden ser consecuencia de lo siguiente:  Winferd Humphrey sonda en la vejiga (sonda urinaria).  Tener la prstata agrandada o inflamada.  Haberse sometido recientemente a [place] de las vas [place]. En los hombres que padecen una enfermedad que debilita el sistema de defensa (sistema inmunitario) del organismo, como el VIH, las causas de esta afeccin pueden ser las siguientes:   Otras bacterias, incluidas la tuberculosis y la sfilis.  Virus.  Hongos. En ocasiones, esta afeccin se presenta sin infeccin. Eso puede ocurrir si la orina retrocede al epiddimo despus de haber levantado un objeto pesado o de haber hecho un esfuerzo. FACTORES DE RIESGO Es ms probable que esta afeccin se manifieste en los hombres que:  Han tenido relaciones sexuales sin proteccin con ms de Medical laboratory scientific officer.  Tienen sexo anal.   Se sometieron recientemente a [place].   Tienen una sonda urinaria.  Tienen problemas urinarios.  Estn inmunodeprimidos. SNTOMAS  Generalmente, esta afeccin se manifiesta sbitamente con escalofros, fiebre, dolor detrs del escroto y en el testculo. Otros sntomas pueden ser los siguientes:   Hinchazn del escroto, el testculo o ambos.  Dolor al eyacular o al Beatrix Shipper.  Dolor en la espalda o el vientre.  Nuseas.  Picazn y secrecin por el pene.  Necesidad frecuente de Geographical information systems officer.  Enrojecimiento y Engineer, miningdolor a Radiation protection practitionerla palpacin en el escroto. DIAGNSTICO El mdico puede diagnosticar esta afeccin en funcin de los sntomas y la historia clnica. El mdico Location managertambin le realizar un examen fsico para hacerle preguntas sobre los sntomas y revisarle tanto el escroto como los testculos a fin de Engineer, manufacturingdetectar signos de hinchazn, dolor y enrojecimiento. Tambin pueden hacerle otros estudios, por ejemplo:   Anlisis de la secrecin que emana del pene.  Anlisis de orina para detectar infecciones,  como las ETS.  El mdico puede hacerle pruebas de deteccin de otras enfermedades de transmisin sexual (ETS), incluido el VIH. TRATAMIENTO El tratamiento de esta afeccin depende de la causa. Si la afeccin se debe a una infeccin bacteriana, pueden recetarle antibiticos por va oral. Si la infeccin bacteriana se ha diseminado a la [place], tal vez deba recibir antibiticos por va intravenosa. El tratamiento de la epididimitis no bacteriana es el cuidado en el hogar, que incluye reposo en cama y elevacin del escroto. Es posible que necesite una ciruga para tratar lo siguiente:  La epididimitis bacteriana que produce la acumulacin de pus en el escroto (absceso).  La epididimitis crnica que no ha respondido a otros tratamientos. INSTRUCCIONES PARA EL CUIDADO EN EL HOGAR Medicamentos  Baxter Internationalome los medicamentos de venta libre y los recetados solamente como se lo haya indicado el mdico.   Si le recetaron un antibitico, tmelo como se lo haya indicado el mdico. No deje de tomar o usar el antibitico aunque la afeccin mejore. Actividad sexual  Si la causa de la epididimitis es una ETS, evite la actividad sexual hasta haber completado el Spring Millstratamiento.  Si el resultado del estudio de una ETS fue positivo, informe a sus Chief Strategy Officerparejas sexuales. Tal vez deban recibir tratamiento.No tenga actividad sexual con sus parejas sexuales hasta haber completado el tratamiento. Instrucciones generales  Reanude sus actividades normales como se lo haya indicado el mdico. Pregntele al mdico qu actividades son seguras para usted.  Mantenga el escroto elevado y bien sujeto mientras hace reposo. Pregntele al mdico si debe usar un dispositivo de sujecin del escroto, como un suspensorio. selo como se lo haya indicado el mdico.  Si se lo indican, aplique hielo en la zona afectada:   Ponga el hielo en una bolsa plstica.  Coloque una toalla entre la piel y la bolsa de hielo.  Coloque el hielo durante  20minutos, 2 a 3veces por Futures traderda.  Intente darse un bao de asiento para Altria Groupaliviar las molestias. Se trata de un bao de agua tibia que se toma mientras se est sentado. El agua solo debe Adult nursellegar hasta las caderas y cubrir las nalgas. Hgalo 3 o 4veces al da o como se lo haya indicado el mdico.  Concurra a todas las visitas de control como se lo haya indicado el mdico. Esto es importante. SOLICITE ATENCIN MDICA SI:   Lance Mussiene fiebre.   El medicamento no IT trainerle calma el dolor.   El dolor Berkeleyempeora.   Los sntomas no mejoran en el trmino de Kinder Morgan Energytres das.   Esta informacin no tiene Theme park managercomo fin reemplazar el consejo del mdico. Asegrese de hacerle al mdico cualquier pregunta que tenga.   Document Released: 05/31/2005 Document Revised: 02/19/2015 Elsevier Interactive Patient Education 2016 ArvinMeritorElsevier Inc.    Opciones de alimentos para pacientes con reflujo gastroesofgico - Adultos (Food Choices for Gastroesophageal Reflux Disease, Adult) Cuando se tiene reflujo gastroesofgico (ERGE), los alimentos que se ingieren y los hbitos de alimentacin son Engineer, productionmuy importantes. Elegir los alimentos adecuados puede ayudar a Yahooaliviar las  molestias ocasionadas por el ERGE. QU PAUTAS GENERALES DEBO SEGUIR?  Elija las frutas, los vegetales, los cereales integrales, los productos lcteos, la carne de [place], de pescado y de ave con bajo contenido de grasas.  Limite las grasas, [address] [place], los aderezos para [place], la [place], los frutos secos y Programme researcher, broadcasting/film/video.  Lleve un registro de las comidas para identificar los alimentos que ocasionan sntomas.  Evite los alimentos que le ocasionen reflujo. Pueden ser distintos para cada persona.  Haga comidas pequeas con frecuencia en lugar de tres comidas OfficeMax Incorporated.  Coma lentamente, en un clima distendido.  Limite el consumo de alimentos fritos.  Cocine los alimentos utilizando mtodos que no sean la fritura.  Evite el consumo alcohol.  Evite  beber grandes cantidades de lquidos con las comidas.  Evite agacharse o recostarse hasta despus de 2 o 3horas de haber comido. QU ALIMENTOS NO SE RECOMIENDAN? Los siguientes son algunos alimentos y bebidas que pueden empeorar los sntomas: Veterinary surgeon. Jugo de tomate. Salsa de tomate y espagueti. Ajes. [place] y [place]. Rbano picante. Frutas Naranjas, pomelos y limn (fruta y [place]). Carnes Carnes de [place], de pescado y de ave con gran contenido de grasas. Esto incluye los perros calientes, las [place], el [place], la salchicha, el salame y el tocino. Lcteos Leche entera y [place]. PPG Industries. Crema. [place]. Helados. Queso crema.  Bebidas Caf y t negro, con o sin cafena Bebidas gaseosas o energizantes. Condimentos Salsa picante. Salsa barbacoa.  Dulces/postres Chocolate y cacao. Rosquillas. Menta y mentol. Grasas y [employer] Life con alto contenido de grasas, incluidas las papas fritas. Otros Vinagre. Especias picantes, como la Br[employer], la pimienta blanca, la pimienta roja, la pimienta de cayena, el curry en [place], los clavos de [place], el jengibre y el [place] en polvo. Los artculos mencionados arriba pueden no ser Raytheon de las bebidas y los alimentos que se Theatre stage manager. Comunquese con el nutricionista para recibir ms informacin.   Esta informacin no tiene Theme park manager el consejo del mdico. Asegrese de hacerle al mdico cualquier pregunta que tenga.   Document Released: 03/10/2005 Document Revised: 06/21/2014 Elsevier Interactive Patient Education Yahoo[employer] Inc.

## 2015-11-15 ENCOUNTER — Ambulatory Visit (INDEPENDENT_AMBULATORY_CARE_PROVIDER_SITE_OTHER): Payer: 59 | Admitting: Family Medicine

## 2015-11-15 ENCOUNTER — Ambulatory Visit (INDEPENDENT_AMBULATORY_CARE_PROVIDER_SITE_OTHER): Payer: 59

## 2015-11-15 VITALS — BP 152/90 | HR 62 | Temp 98.2°F | Resp 16 | Ht 68.5 in | Wt 210.0 lb

## 2015-11-15 DIAGNOSIS — E119 Type 2 diabetes mellitus without complications: Secondary | ICD-10-CM | POA: Diagnosis not present

## 2015-11-15 DIAGNOSIS — M545 Low back pain, unspecified: Secondary | ICD-10-CM

## 2015-11-15 DIAGNOSIS — N50812 Left testicular pain: Secondary | ICD-10-CM | POA: Diagnosis not present

## 2015-11-15 DIAGNOSIS — R350 Frequency of micturition: Secondary | ICD-10-CM | POA: Diagnosis not present

## 2015-11-15 LAB — POC MICROSCOPIC URINALYSIS (UMFC): MUCUS RE: ABSENT

## 2015-11-15 LAB — POCT URINALYSIS DIP (MANUAL ENTRY)
Bilirubin, UA: NEGATIVE
Blood, UA: NEGATIVE
Glucose, UA: NEGATIVE
Ketones, POC UA: NEGATIVE
LEUKOCYTES UA: NEGATIVE
Nitrite, UA: NEGATIVE
PH UA: 6.5
PROTEIN UA: NEGATIVE
Spec Grav, UA: 1.02
UROBILINOGEN UA: 0.2

## 2015-11-15 LAB — GLUCOSE, POCT (MANUAL RESULT ENTRY): POC GLUCOSE: 98 mg/dL (ref 70–99)

## 2015-11-15 LAB — HIV ANTIBODY (ROUTINE TESTING W REFLEX): HIV: NONREACTIVE

## 2015-11-15 MED ORDER — CIPROFLOXACIN HCL 500 MG PO TABS: 500.0000 mg | ORAL_TABLET | Freq: Two times a day (BID) | ORAL | Status: DC

## 2015-11-15 MED ORDER — CYCLOBENZAPRINE HCL 5 MG PO TABS: ORAL_TABLET | ORAL | Status: DC

## 2015-11-15 NOTE — Progress Notes (Signed)
I have sch appt for US for him, since was not done in March as ordered. He will go for US on Monday at [facility].

## 2015-11-15 NOTE — Progress Notes (Signed)
By signing my name below I, [name], attest that this documentation has been prepared under the direction and in the presence of Shade FloodJeffrey R Eugenie Harewood, MD. Electonically Signed. [name], Scribe 11/15/2015 at 10:08 AM    Subjective:    Patient ID: Tommy PoreGregorio Rosario, male    DOB: March 31, 1976, 40 y.o.   MRN: [id number]  Chief Complaint  Patient presents with  . Follow-up    back pain and testicle pain/ pt states he is not feeling better    HPI Tommy PoreGregorio Solarz is a 40 y.o. male who presents to the [facility] for follow evaluation for his back pain. Pt initially seen by Dr Neva SeatGreene c/o left low back and testicle pain for 2 weeks on 08/30/15. Pt had history of cysts on his testicles 6 years prior. Urinalysis negative at that time. Pt suspicious for epididymitis and treated with cipro. Pt had an US of scrotum ordered, which was not completed. Pt reports that he was never called by US for follow up. Low back pain was likely mechanical vs degenerative disease. Initial treatment with NSAIDs and flexeril.  Back pain is the same today. Pain initially resolved with treatment. A month after finishing treatment his pain returned. Pain is in the left back and radiates to his left testicle. Pt denies any strain or trauma prior to onset of pain. Pt denies any bowel or urinary incontinence.  Pt also reports increased urinary frequency. Pt denies any penile discharge. Pt denies any new sexual partners. Pt has been married with the same sexual partner for the past 10 years.   Pt also requesting to have his ears looked at. Pt denies any ear pain or changes in his hearing.   Pt is spanish speaking, spanish was spoke, understood and expressed.    There are no active problems to display for this patient.  History reviewed. No pertinent past medical history. History reviewed. No pertinent past surgical history. No Known Allergies Prior to Admission medications   Medication Sig  Start Date End Date Taking? Authorizing Provider  ciprofloxacin (CIPRO) 500 MG tablet Take 1 tablet (500 mg total) by mouth 2 (two) times daily. Patient not taking: Reported on 11/15/2015 08/30/15   Shade FloodJeffrey R Melessa Cowell, MD  cyclobenzaprine (FLEXERIL) 5 MG tablet 1 pill by mouth up to every 8 hours as needed. Start with one pill by mouth each bedtime as needed due to sedation Patient not taking: Reported on 11/15/2015 08/30/15   Shade FloodJeffrey R Jarvin Ogren, MD   Social History   Social History  . Marital Status: Married    Spouse Name: N/A  . Number of Children: N/A  . Years of Education: N/A   Occupational History  . Not on file.   Social History Main Topics  . Smoking status: Never Smoker   . Smokeless tobacco: Not on file  . Alcohol Use: No  . Drug Use: No  . Sexual Activity: Not on file   Other Topics Concern  . Not on file   Social History Narrative      Review of Systems  Constitutional: Negative for fever.  HENT: Negative for ear pain.   Respiratory: Negative for cough.   Genitourinary: Positive for frequency, flank pain (left) and testicular pain (left). Negative for discharge.  Musculoskeletal: Positive for back pain (left lumbar region).       Objective:   Physical Exam  Constitutional: He is oriented to person, place, and time. He appears well-developed and well-nourished. No distress.  HENT:  Head: Normocephalic and atraumatic.  Right Ear: Hearing, tympanic membrane and ear canal normal.  Left Ear: Hearing, tympanic membrane and ear canal normal.  Eyes: Conjunctivae and EOM are normal. Pupils are equal, round, and reactive to light.  Neck: Normal range of motion. Neck supple.  Cardiovascular: Normal rate and regular rhythm.  Exam reveals no gallop and no friction rub.   No murmur heard. Pulmonary/Chest: Effort normal and breath sounds normal. He has no decreased breath sounds. He has no wheezes. He has no rhonchi. He has no rales.  Abdominal: Normal appearance and bowel  sounds are normal. There is tenderness in the suprapubic area. Hernia confirmed negative in the right inguinal area and confirmed negative in the left inguinal area.  Genitourinary: Penis normal. Right testis shows no tenderness. Left testis shows tenderness (mild diffuse tenderness). Left testis shows no mass and no swelling. No discharge found.  No penile rash.  Musculoskeletal:       Lumbar back: He exhibits tenderness (left paralumbar region). He exhibits normal range of motion and no bony tenderness.  Neurological: He is alert and oriented to person, place, and time. He has normal strength. Gait normal. He displays no Babinski's sign on the right side. He displays no Babinski's sign on the left side.  Reflex Scores:      Patellar reflexes are 2+ on the right side and 2+ on the left side.      Achilles reflexes are 2+ on the right side and 2+ on the left side. Pt has normal heel and toe walk.  Skin: Skin is warm, dry and intact. No rash noted.  Psychiatric: He has a normal mood and affect. His behavior is normal.    Filed Vitals:   11/15/15 0815  BP: 152/90  Pulse: 62  Temp: 98.2 F (36.8 C)  TempSrc: Oral  Resp: 16  Height: 5' 8.5" (1.74 m)  Weight: 210 lb (95.255 kg)  SpO2: 98%   Results for orders placed or performed in visit on 11/15/15  POCT glucose (manual entry)  Result Value Ref Range   POC Glucose 98 70 - 99 mg/dl  POCT urinalysis dipstick  Result Value Ref Range   Color, UA yellow yellow   Clarity, UA clear clear   Glucose, UA negative negative   Bilirubin, UA negative negative   Ketones, POC UA negative negative   Spec Grav, UA 1.020    Blood, UA negative negative   pH, UA 6.5    Protein Ur, POC negative negative   Urobilinogen, UA 0.2    Nitrite, UA Negative Negative   Leukocytes, UA Negative Negative  POCT Microscopic Urinalysis ([facility])  Result Value Ref Range   WBC,UR,HPF,POC Few (A) None WBC/hpf   RBC,UR,HPF,POC None None RBC/hpf   Bacteria Few (A)  None, Too numerous to count   Mucus Absent Absent   Epithelial Cells, UR Per Microscopy None None, Too numerous to count cells/hpf   Dg Lumbar Spine Complete  11/15/2015  CLINICAL DATA:  Acute left low back pain without sciatica. EXAM: LUMBAR SPINE - COMPLETE 4+ VIEW COMPARISON:  08/30/2015 FINDINGS: There is no evidence of lumbar spine fracture. Alignment is normal. Intervertebral disc spaces are maintained. Preserved vertebral body heights. Normal pedicles. No pars defects. IMPRESSION: No acute finding by plain radiography Electronically Signed   By: Judie Petit.  Shick M.D.   On: 11/15/2015 10:03         Assessment & Plan:   Jaycion Treml is a 40 y.o. male Left testicular pain -  Plan: HIV antibody, GC/Chlamydia Probe Amp, ciprofloxacin (CIPRO) 500 MG tablet, [place] Art/Ven Flow Abd Pelv Doppler, US Scrotum Urinary frequency - Plan: POCT glucose (manual entry), POCT urinalysis dipstick, POCT Microscopic Urinalysis ([facility]), [place] Art/Ven Flow Abd Pelv Doppler  - Possible recurrence of epididymoorchitis. Few white blood cells on micro-. Reproducible tenderness left testicle without mass or swelling apparent. Will check scrotal ultrasound, start Cipro, check chlamydia/gonorrhea testing. If persistent discomfort, recommend evaluation with urologist. RTC precautions  Left-sided low back pain without sciatica - Plan: DG Lumbar Spine Complete Left low back pain, with sciatica presence unspecified - Plan: cyclobenzaprine (FLEXERIL) 5 MG tablet  - mechanical low back pain versus strain. Flexeril daily at bedtime when necessary, home exercise program and handout given. RTC precautions   Diet-controlled diabetes mellitus (HCC) - Plan: POCT glucose (manual entry)  - Glucose overall normal in office. Does not appear to be causing urinary frequency.  Spanish-speaking. Spanish spoken with understanding expressed.   Meds ordered this encounter  Medications  . ciprofloxacin (CIPRO) 500 MG tablet    Sig: Take 1  tablet (500 mg total) by mouth 2 (two) times daily.    Dispense:  20 tablet    Refill:  0  . cyclobenzaprine (FLEXERIL) 5 MG tablet    Sig: 1 pill by mouth up to every 8 hours as needed. Start with one pill by mouth each bedtime as needed due to sedation    Dispense:  15 tablet    Refill:  0   Patient Instructions   Your Ultrasound is scheduled for Monday June 5th at 1:30 at [facility]     Driving directions to [facility]   - more info    [address]  [place], [place] [address]     1. Head south on [address] toward [address]      0.7 mi    2. Turn left onto [address]      0.4 mi    3. Take the 3rd right onto [facility] [address]      1.1 mi    4. Take the [address] ramp to [facility]      0.2 mi    5. Merge onto [address]      3.7 mi    6. Take [address] toward [place]      0.3 mi    7. Keep left at the fork, follow signs for Airport      381 ft    8. Keep left at the fork, follow signs for [facility] [address]/[place]      302 ft    9. Turn left onto [address]      2.6 mi    10. Turn right onto [address] will be on the left     0.2 mi     [facility]    .    IF you received an x-ray today, you will receive an invoice from [facility]. Please contact [facility] at [phone] with questions or concerns regarding your invoice.   IF you received labwork today, you will receive an invoice from [employer]. Please contact [facility] at [phone] with questions or concerns regarding your invoice.   Our billing staff will not be able to assist you with questions regarding bills from these companies.  You will be contacted with the lab results as  soon as they are available. The fastest way to get your results is to activate your My Chart account. Instructions are located on the last page of this paperwork. If you have not  heard from [place] regarding the results in 2 weeks, please contact this office.    ejercicios por dolor de espalda, y cyclobenzaprine si necesario. si no esta mejor en proximo 2 semanas - regrese. Mas temprano si empeorse.   por dolor en testicula - es posible infeccion. Tome cipro otra vez, voy a Engineer, mining, y Sales executive de su testicula esta semana. si simptomas remanecer despues esta semana, es posible referir a specialista. regrese mas temprano si empeorse.   Distensin de la cintura con rehabilitacin (Low Back Strain With Rehab) Neomia Dear distensin es una lesin en la que el tendn o msculo se rasga. Los msculos y tendones de la cintura son susceptibles de sufrir distensiones. Sin embargo, estos msculos y tendones son Lynnae Sandhoff fuertes y se requiere de una gran fuerza para lesionarlos. Los esguinces se clasifican en tres categoras. En el esguince de grado 1 hay dolor, pero el tendn no est estirado. En los esguinces de grado 2 hay un ligamento alargado, debido a un estiramiento o desgarro parcial. En el esguince de grado 2 an hay funcionamiento, aunque ste puede disminuir. Una distensin en grado 3 es la ruptura completa del msculo o el tendn, y suele quedar incapacitada la funcin. SNTOMAS  Dolor en la cintura.  Dolor en la espalda, que a menudo afecta ms a un lado que al [employer].  Dolor que empeora con los movimientos y se siente en las caderas, las nalgas o la zona posterior del muslo.  Espasmos en los msculos de la espalda.  Hinchazn a lo largo de los msculos de la espalda.  Prdida de la fuerza en estos msculos.  Ruido de "crack" (crepitacin) al tocarlos. CAUSAS  La distensin se produce cuando se coloca una fuerza en el msculo o tendn que es mayor de lo que puede soportar. Las causas ms frecuentes de la lesin son:  Benedetto Goad prolongado de la unidad msculo - tendn en la zona de la cintura, generalmente debido a Landscape architect.  Una  nica lesin o fuerza violenta aplicada en la espalda. LOS RIESGOS AUMENTAN CON  Cualquier deporte en el que el movimiento ocasione una fuerza de giro en la espalda o una gran inclinacin de la cintura; (ftbol americano, rugby, levantamiento de pesas, bowling, golf, tenis, patinaje, deportes con raqueta, natacin, carrera, gimnasia, clavados).  Poca fuerza y flexibilidad.  No hacer un precalentamiento adecuado.  Historia familiar de dolor de cintura o trastornos en discos.  Cirugas previas en la espalda (especialmente fusin).  Tcnica incorrecta al levantar objetos pesados.  Permanecer largo tiempo sentado, especialmente de manera incorrecta. MEDIDAS DE PREVENCIN  Aprenda y Child psychotherapist correcta al sentarse o al levantarse (mantener la postura correcta al sentarse; levantar objetos inclinando las rodillas y las piernas y no la cintura).  Precalentamiento adecuado y elongacin antes de la [place].  Descanso y recuperacin entre actividades.  Mantener la forma fsica:  Earma [place], flexibilidad y resistencia muscular.  Capacidad cardiovascular. PRONSTICO Si se trata adecuadamente, generalmente es curable dentro de las 6 semanas. POSIBLES COMPLICACIONES:  La recurrencia frecuente de los sntomas puede dar como resultado un problema crnico.  La inflamacin crnica, degeneracin cicatrizal del tendn y ruptura parcial del tendn.  Demora de la curacin o de la resolucin de los sntomas.  Discapacidad prolongada. CONSIDERACIONES GENERALES PARA EL  TRATAMIENTO El tratamiento inicial incluye el uso de medicamentos y la aplicacin de hielo para reducir Chief Technology Officer y la inflamacin. Los ejercicios de elongacin y fortalecimiento pueden ayudar a reducir Chief Technology Officer con la [place]. Los ejercicios pueden Management consultant o con un terapeuta. Los [employer]orne graves pueden requerir la derivacin a un fisioterapeuta para Magazine features editor evaluacin y Games developer un tratamiento, como  ultrasonido. El profesional podr indicarle el uso de un dispositivo ortopdico para ayudar a Glass blower/designer y la inflamacin. A menudo, demasiado reposo en cama podr resultar en ms daos que beneficios. Podrn prescribirle inyecciones de corticoides. Sin embargo, esto deber reservarse para los casos ms graves. Es Therapist, occupational uso de la espalda cuando se levantan objetos. Por la noche, se aconseja que usted [place], sobre un [address] y coloque una almohada debajo de las rodillas. Si no se obtiene xito con Artist, ser necesario someterse a [place].  MEDICAMENTOS   Si es necesaria la administracin de medicamentos para Chief Technology Officer, se recomiendan los antiinflamatorios no esteroides, como aspirina e ibuprofeno y otros calmantes menores, como acetaminofeno.  No tome medicamentos para el dolor dentro de los [address] previos a la [place].  El profesional podr prescribirle calmantes si lo considera necesario. Utilcelos como se le indique y slo cuando lo necesite.  Podr beneficiarse con [employer] Insurance and An[employer]ity [employer].  En algunos casos se indica una inyeccin de corticosteroides. Estas inyecciones deben reservarse para los [place] graves, porque slo se pueden administrar una determinada cantidad de veces. CALOR Y FRO   El fro (con hielo) debe aplicarse durante 10 a 15 minutos cada 2  3 horas para reducir la inflamacin y Chief Technology Officer e inmediatamente despus de cualquier actividad que agrava los sntomas. Utilice bolsas o un masaje de hielo.  El calor puede usarse antes de Therapist, music y de las actividades de fortalecimiento indicadas por el profesional, le fisioterapeuta o Orthoptist. Utilice una bolsa trmica o un pao hmedo. SOLICITE ATENCIN MDICA SI:   Los sntomas empeoran o no mejoran en 2 a 4 semanas, an realizando Pharmacist, community.  Siente adormecimiento, debilitamiento o prdida de control de la vejiga o el intestino.  Desarrolla nuevos e  inexplicables sntomas. (Los medicamentos indicados en el tratamiento le ocasionan efectos secundarios). EJERCICIOS  EJERCICIOS DE AMPLITUD DE MOVIMIENTOS Cherlynn June Distensin de cintura La mayora de las personas con dolor de espalda baja encuentran que sus sntomas empeoran al doblarse hacia adelante (flexin) o al arquear la regin inferior de la espalda (extensin). Los ejercicios que le ayudarn a Oncologist sus sntomas se Research scientist (life sciences).  El mdico, fisioterapeuta o Research scientist (physical sciences) ayudarn a Chief Strategy Officer qu ejercicios sern de ayuda para resolver su dolor de espalda. No realice ningn ejercicio sin consultarlo antes con el profesional. Discontinue los ejercicios que empeoran sus sntomas, hasta que hable con el mdico. Si siente dolor, entumecimiento u hormigueo que [employer] glteos, piernas o pies, el objetivo de esta terapia es que estos sntomas se acerquen a la espalda y Customer service manager. A veces, estos sntomas en las piernas mejoran, pero el dolor de espalda empeora. Este suele ser un indicio de progreso en su rehabilitacin. Asegrese de que estar atento a cualquier cambio en sus sntomas y las actividades que ha [employer]	[place] 24 horas antes del cambio. Compartir esta informacin con su mdico le permitir mayor eficiencia para tratar su enfermedad.  Estos ejercicios le ayudarn en la recuperacin de la lesin. Los  sntomas podrn aliviarse con o sin asistencia adicional de su mdico, fisioterapeuta o Herbalist. Al completar estos ejercicios, recuerde:  Restaurar la flexibilidad del tejido ayuda a que las articulaciones recuperen el movimiento normal. Esto permite que el movimiento y la actividad sea ms saludables y menos dolorosos.  Para que sea efectiva, cada elongacin debe realizarse durante al menos 30 segundos.  La elongacin nunca debe ser dolorosa. Deber sentir slo un alargamiento o distensin suave del tejido que estira. EJERCICIOS DE  AMPLITUD DE MOVIMIENTOS Y ELONGACIN: ELONGACION Flexin - una rodilla al pecho  Recustese en una cama dura o sobre el piso, con ambas piernas extendidas al frente.  Manteniendo una pierna en contacto con el piso, lleve la rodilla opuesta al pecho. Mantenga la pierna en esa posicin, sostenindola por la zona posterior del muslo o por la rodilla.  Presione hasta sentir un suave estiramiento en la cintura. Mantenga esta posicin durante __________ segundos.  Libere la pierna lentamente y repita el ejercicio con el lado opuesto. Reptalo __________ veces. Realice este ejercicio __________ veces por da.  ELONGACIN Flexin, dos rodillas al pecho   Recustese en una cama dura o sobre el piso, con ambas piernas extendidas al frente.  Manteniendo una pierna en contacto con el piso, lleve la rodilla opuesta al pecho.  Tense los msculos del estmago para apoyar la espalda y levante la otra rodilla [place]. Mantenga las piernas en su lugar y tmese por detrs de las caderas o las rodillas.  Con ambas rodillas en el pecho, tire hasta que sienta un estiramiento en la parte trasera de la espalda. Mantenga esta posicin durante __________ segundos.  Tense los msculos del estmago y baje las piernas de a una por vez. Reptalo __________ veces. Realice este ejercicio __________ veces por da.  ELONGACIN Rotacin de la zona baja del tronco  Recustese sobre una cama firme o sobre el suelo. Mantenga las piernas al frente, doble las rodillas de modo que ambas apunten hacia el techo y los pies queden bien apoyados en el piso.  Extienda los brazos a Dance movement psychotherapist. Esto estabilizar la zona superior del cuerpo, manteniendo los hombros en contacto con el piso.  Suave y lentamente deje caer ambas rodillas juntas hacia un lado, hasta sentir un ligero estiramiento en la cintura. Mantenga esta posicin durante __________ segundos.  Tensione los [employer] del estmago para Occupational psychologist la cintura mientras  lleva las rodillas nuevamente a la posicin inicial. Repita el ejercicio hacia el otro lado. Reptalo __________ veces. Realice este ejercicio __________ veces por da. EJERCICIOS DE AMPLITUD DE MOVIMIENTOS Y FLEXIBILIDAD: ELONGACIN Extensin posicin prona sobre los codos  Acustese sobre el estmago sobre el piso, una cama ser muy blanda. Coloque las palmas a una distancia igual al ancho de los hombres y a la altura de la cabeza.  Coloque los codos bajo los hombros. Si siente dolor, colquese almohadas debajo del pecho.  Deje que su cuerpo se relaje, de modo que las caderas queden ms abajo y tengan ms contacto con el piso.  Mantenga esta posicin durante __________ segundos.  Vuelva lentamente a la posicin plana sobre el piso. Reptalo __________ veces. Realice este ejercicio __________ veces por da.  AMPLITUD DE MOVIMIENTOS - Extensin - flexin de brazos en posicin prona  Acustese sobre el [employer]	[place] piso, una cama ser [place]. Coloque las palmas a una distancia igual al ancho de los hombres y a la altura de la cabeza.  Mantenga la espalda tan relajada como pueda, enderece  lentamente los codos [employer] caderas contra el suelo. Puede modificar la posicin de las manos para estar ms cmodo. A medida que gana movimiento, sus manos quedarn ms por debajo de los hombros.  Mantenga cada posicin durante __________segundos.  Vuelva lentamente a la posicin plana sobre el piso. Reptalo __________ veces. Realice este ejercicio __________ veces por da.  AMPLITUD DE MOVIMIENTOS Cuadrpedo [place] vertebral neutral  Coloque las manos y las rodillas en una superficie firme. Las manos deben quedar a la altura de los hombros y las rodillas debajo de las caderas. Puede colocar algo debajo las rodillas para estar ms cmodo.  Haga caer la cabeza y apunte el cccix hacia el suelo debajo de usted. De este modo se redondear la cintura, en [place] similar a un  gato enojado. Mantenga esta posicin durante __________ segundos.  Lentamente levante la cabeza y afloje el cccix para que se hunda el cuerpo en un gran arco, como un caballo.  Mantenga esta posicin durante __________ segundos.  Reptalo hasta sentir calor en la cintura.  Ahora encuentre su "punto ideal". Ser la posicin ms cmoda [employer]. En esta posicin es cuando su columna est neutral. Una vez que encuentre la posicin, tensione los msculos del estmago para sostener la zona inferior de la espalda.  Mantenga esta posicin durante __________ segundos. Reptalo __________ veces. Realice este ejercicio __________ veces por da.  EJERCICIOS DE FORTALECIMIENTO - Distensin de la cintura Estos ejercicios le ayudarn en la recuperacin de la lesin. Estos ejercicios deben hacerse cerca de su "punto dulce". Este es el arco neutro, de la parte baja de la espalda, en algn lugar entre la posicin completamente redondeada y arqueada plenamente, que es la posicin menos dolorosa. Cuando se realiza en [employer] de seguridad del movimiento, estos ejercicios se pueden Chemical engineer para las personas que tienen una lesin basada en flexin o extensin. Con estos ejercicios, los sntomas podrn desaparecer con o sin mayor intervencin del profesional, el fisioterapeuta o Orthoptist. Al completar estos ejercicios, recuerde:   Los msculos pueden ganar la resistencia y la fuerza necesarias para las actividades diarias a travs de ejercicios controlados.  Realice los ejercicios como se lo indic el mdico, el fisioterapeuta o Orthoptist. Aumente la resistencia y las repeticiones segn se le haya indicado.  Podr experimentar dolor o cansancio muscular, pero el dolor o molestia que trata de eliminar a travs de los ejercicios nunca debe empeorar. Si el dolor empeora, detngase y asegrese de que est siguiendo las directivas correctamente. Si an siente dolor luego de Education officer, environmental  lo ajustes necesarios, deber discontinuar el ejercicio hasta que pueda conversar con el profesional sobre el problema. FORTALECIMIENTO - Abdominales profundos - Inclinacin plvica  Recustese sobre una cama firme o sobre el suelo. Mantenga las piernas al frente, doble las rodillas de modo que ambas apunten hacia el techo y los pies queden bien apoyados en el piso.  Tensione la zona baja de los msculos abdominales para presionar la Oncologist. Este movimiento har rotar su pelvis de modo que el cccix quede hacia arriba y no apuntando a los pies o hacia el piso.  Con una tensin suave y respiracin pareja, mantenga esta posicin durante __________ segundos. Reptalo __________ veces. Realice este ejercicio __________ veces por da.  FORTALECIMIENTO Abdominales encogimiento abdominal  Recustese sobre una cama firme o sobre el suelo. Mantenga las piernas al frente, doble las rodillas de modo que ambas apunten hacia el techo y los pies  queden bien apoyados [employer]. Cruce las [employer].  Apunte suavemente con la barbilla hacia abajo, sin doblar el cuello.  Tensione los abdominales y eleve lentamente el tronco la altura suficiente para despegar los omplatos. Si se eleva ms, pondr tensin excesiva en la cintura y esto no fortalecer ms los abdominales.  Controle la vuelta a la posicin inicial. Reptalo __________ veces. Realice este ejercicio __________ veces por da.  EN CUATRO MIEMBROS Cuadrpedo, elevacin de miembro superior e inferior opuestos   [employer] y las rodillas en una superficie firme. Las manos deben quedar a la altura de los hombros y las rodillas debajo de las caderas. Puede colocar algo debajo las rodillas para estar ms cmodo.  Encuentre la posicin neutral de la columna vertebral y Public house manager los msculos abdominales de modo que pueda mantener esta posicin. Los hombros y las caderas deben formar un rectngulo paralelo con el  suelo y recto.  Manteniendo el tronco firme, eleve la mano derecha a la altura del hombro y luego eleve la pierna izquierda a la altura de la cadera. Asegrese de no contener la respiracin. Mantenga cada posicin durante __________ segundos.  Con los msculos abdominales en tensin y la espalda firme, vuelva lentamente a la posicin inicial. Repita con el otro brazo y la otra pierna. Reptalo __________ veces. Realice este ejercicio __________ veces por da.  FORTALECIMIENTO Abdominales bajos Elevacin de ambas rodillas  Recustese sobre una cama firme o sobre el suelo. Mantenga las piernas al frente, doble las rodillas de modo que ambas apunten hacia el techo y los pies queden bien apoyados en el piso.  Tensione los msculos abdominales que se encuentran en la cintura y eleve lentamente ambas rodillas hasta que queden sobre las caderas. Asegrese de no contener la respiracin.  Mantenga esta posicin durante __________ segundos. Usando los abdominales, regrese a la posicin inicial en un modo lento y controlado. Reptalo __________ veces. Realice este ejercicio __________ veces por da.  CONSIDERACIONES ACERCA DE LA POSTURA Y LA MECNICA DEL CUERPO Ditensin de la cintura Si mantiene una postura correcta cuando se encuentre de pie, sentado o realizando sus actividades, reducir el estrs [employer] diferentes tejidos del cuerpo, y Acupuncturist a los tejidos lesionados la posibilidad de curarse y Film/video editor las experiencias dolorosas. A continuacin se indican pautas generales para mejorar la postura- Su mdico o fisioterapeuta le dar instrucciones especficas segn sus necesidades. Al leer estas pautas recuerde:  Los ejercicios indicados por su mdico lo ayudarn a Chartered certified accountant flexibilidad y la fuerza para Pharmacologist las posturas correctas.  La postura correcta proporciona el mejor entorno de trabajo para las articulaciones. Las articulaciones se desgastan menos cuando estn sostenidas adecuadamente por una  columna vertebral en buena postura. Esto significa que su cuerpo estar ms sano y Research officer, trade union.  La correcta postura debe practicarse en todas las actividades, especialmente al estar sentado o de pie durante [place]. Tambin es importante al realizar actividades repetitivas de bajo estrs (tipeo) o una actividad nica y pesada. POSICIONES DE Dellie Catholic Tenga en cuenta cules son las posturas que ms dolor le causan al elegir una posicin de descanso. Si siente dolor con las actividades en que deba realizar una flexin (sentarse, inclinarse, detenerse, ponerse en cuclillas), elija una posicin que le permita descansar en una postura menos flexionada. Evite curvarse en posicin fetal cuando se encuentre de lado. Si el dolor empeora con las actividades basadas en la extensin (estar de pie durante un tiempo prolongado, trabajar con  las manos por arriba de la cabeza) evite descansar en una posicin extendida durante mucho tiempo, como dormir sobre el [place]. La [employer] de las Psychologist, forensic cmodo el descanso sobre la columna vertebral en una posicin neutral, ni muy redondeada ni [place]. Recustese sobre su lado en una cama que no est hundida con una almohada entre las rodillas o sobre la espalda con una almohada bajo las rodillas, y sentir [place]. Tenga en cuenta que cualquier posicin en [employer], no importa si es una postura [place], puede provocarle rigidez. POSTURAS CORRECTAS PARA SENTARSE Con el fin de minimizar el estrs y Environmental health practitioner en su columna, deber sentarse con la postura correcta. Sentarse con una buena postura debe ser algo sin esfuerzo para un cuerpo sano. Recuperar una buena postura es un proceso gradual. Muchas personas pueden trabajar ms cmodas mediante el uso de diferentes soportes hasta que tengan la flexibilidad y la fuerza para mantener esta postura por su cuenta. Al sentarse con la [employer], los odos deben estar sobre los hombros y los hombros  sobre las caderas. Debe utilizar el respaldo de la silla para apoyar la espalda. La espalda estar en una posicin neutral, ligeramente arqueada. Puede colocar una pequea almohada o toalla doblada en la base de la espalda baja para apoyo.  Si trabaja en un escritorio, cree un ambiente que le proporciones un buen soporte y [place]. Sin apoyo adicional, msculos se cansan, lo que lleva a una tensin excesiva en las articulaciones y otros tejidos. Tenga en cuenta estas recomendaciones: SILLA:   La silla debe poder deslizarse por debajo del escritorio cuando su espalda tome contacto con el respaldo. Esto le permitir trabajar ms cerca.  La altura de la silla debe permitirle que los ojos tengan el nivel de la parte superior del monitor y las manos estn ms abajo que los codos. POSICIN DEL CUERPO  Los pies deben tener contacto con el piso. Si no es posible, use un posapies.  Mantenga las [employer] hombros. Esto reducir el estrs en el cuello y en la cintura. POSTURAS INCORRECTAS PARA SENTARSE  Si se siente cansado e incapaz de asumir una postura sentada sana, no se eche hacia atrs. Esto pone una tensin excesiva en los tejidos de su espalda, y causa ms dao y Engineer, mining. Entre las opciones ms saludables se incluyen:  El uso de ms apoyo, como una almohada lumbar.  Cambio de tareas, a algo que demande una posicin vertical o caminar.  Tomar una breve caminata.  Recostarse y Theatre stage manager posicin neutral. DE PIE DURANTE UN TIEMPO PROLONGADO E INCLINADO LIGERAMENTE HACIA ADELANTE Cuando deba realizar una tarea que requiera inclinacin hacia adelante estando de pie en el mismo sitio durante mucho tiempo, coloque un pie en un objeto de 2 a 4 pulgadas de alto, para [employer]. Cuando ambos pies estn en el piso, la zona inferior de la espalda tiene a perder su ligera curvatura hacia adentro. Si esta curva se aplana (o se pronuncia demasiado) la espalda y las  articulaciones experimentarn demasiado estrs, se fatigarn ms rpidamente y Geophysicist/field seismologist. POSTURAS CORRECTAS PARA ESTAR DE PIE Una postura adecuada de pie realizarse en todas las actividades diarias, incluso si slo toman un momento, como al [employer]. Como en la postura de sentado, los odos deben estar sobre los hombros y los hombros sobre las caderas. Deber mantener una ligera tensin en sus msculos abdominales para asegurar la columna vertebral. El cccix debe apuntar [place]  el suelo, no detrs de su cuerpo, ya que resultara en una curvatura de la espalda sobre-extendida.  POSTURAS INCORRECTAS PARA ESTAR DE PIE Las posturas incorrectas para estar de pie incluyen tener la cabeza hacia delante, las rodillas bloqueadas o una excesiva curvatura de la espalda. CAMINAR [place] en [place] Payment erguida. Las [place], hombros y caderas deben estar alineados. ACTIVIDAD PROLONGADA EN UNA POSICIN FLEXIONADA Al completar una tarea que requiere que se doble la cintura hacia adelante o inclinarse sobre una superficie baja, trate de encontrar una manera de estabilizar 3 de cada 4 de sus miembros. Puede colocar una mano o el codo en el [place], o descansar una rodilla en la superficie en la que est apoyado. Esto le proporcionar ms estabilidad para que sus msculos no se cansen tan rpidamente. El [employer] rodillas [place], o ligeramente dobladas, tambin reducir el estrs en la espalda baja. TCNICAS CORRECTAS PARA LEVANTAR OBJETOS SI:   Asumir una postura amplia. Esto le proporcionar ms estabilidad y la oportunidad de acercarse lo ms posible al objeto que se est levantando.  Tense los abdominales para asegurar la columna vertebral. Doble las rodillas y las caderas. Manteniendo la espalda en una posicin neutral, haga el esfuerzo con los msculos de la pierna. Levntese con las piernas, manteniendo la espalda derecha.  Pruebe el peso de los objetos desconocidos antes de tratar de  Secondary school teacher.  Trate de [employer] codos hacia abajo y a los lados, con el fin de obtener la fuerza de los hombros al llevar un objeto.  Siempre pida ayuda a otra persona cuando deba levantar objetos pesados o incmodos. TCNICAS INCORRECTAS PARA LEVANTAR OBJETOS NO:   Bloquee rodillas al levantar, aunque sea un objeto pequeo.  Se doble ni gire. Gire sobre los pies ni los mueva cuando necesite cambiar de direccin.  Considere que no puede levantar incluso un clip de papel con seguridad, sin Clinical biochemist.   Esta informacin no tiene Theme park manager el consejo del mdico. Asegrese de hacerle al mdico cualquier pregunta que tenga.   Document Released: 03/17/2006 Document Revised: 10/15/2014 Elsevier Interactive Patient Education [employer]     I personally performed the services described in this documentation, which was scribed in my presence. The recorded information has been reviewed and considered, and addended by me as needed.   Signed,   Meredith Staggers, MD [facility].  11/15/2015 10:10 AM

## 2015-11-15 NOTE — Patient Instructions (Addendum)
Your Ultrasound is scheduled for Monday June 5th at 1:30 at [facility]     Driving directions to [facility]   - more info    [address]  [place], [place] [address]     1. Head south on [address] toward [address]      0.7 mi    2. Turn left onto [address]      0.4 mi    3. Take the 3rd right onto [facility] [address]      1.1 mi    4. Take the [address] ramp to [facility]      0.2 mi    5. Merge onto [address]      3.7 mi    6. Take [address] toward [place]      0.3 mi    7. Keep left at the fork, follow signs for Airport      381 ft    8. Keep left at the fork, follow signs for [facility]      302 ft    9. Turn left onto [address]      2.6 mi    10. Turn right onto [address] will be on the left     0.2 mi     [facility]    .    IF you received an x-ray today, you will receive an invoice from [facility]. Please contact [facility] at [phone] with questions or concerns regarding your invoice.   IF you received labwork today, you will receive an invoice from [employer]. Please contact [facility] at [phone] with questions or concerns regarding your invoice.   Our billing staff will not be able to assist you with questions regarding bills from these companies.  You will be contacted with the lab results as soon as they are available. The fastest way to get your results is to activate your My Chart account. Instructions are located on the last page of this paperwork. If you have not heard from [place] regarding the results in 2 weeks, please contact this office.    ejercicios por dolor de espalda, y cyclobenzaprine si necesario. si no esta mejor en proximo 2 semanas - regrese. Mas temprano si empeorse.   por dolor en testicula - es posible infeccion. Tome cipro otra vez, voy a Engineer, mining, y Child psychotherapist de su testicula esta semana. si simptomas remanecer despues esta semana, es posible referir a specialista. regrese mas temprano si empeorse.   Distensin de la cintura con rehabilitacin (Low Back Strain With Rehab) Neomia Dear distensin es una lesin en la que el tendn o msculo se rasga. Los msculos y tendones de la cintura son susceptibles de sufrir distensiones. Sin embargo, estos msculos y tendones son Lynnae Sandhoff fuertes y se requiere de una gran fuerza para lesionarlos. Los esguinces se clasifican en tres categoras. En el esguince de grado 1 hay dolor, pero el tendn no est estirado. En los esguinces de grado 2 hay un ligamento alargado, debido a un estiramiento o desgarro parcial. En el esguince de grado 2 an hay funcionamiento, aunque ste puede disminuir. Una distensin en grado 3 es la ruptura completa del msculo o el tendn, y suele quedar incapacitada la funcin. SNTOMAS  Dolor en la cintura.  Dolor en la espalda, que a menudo afecta ms a un  lado que al CIGNA.  Dolor que empeora con los movimientos y se siente en las caderas, las nalgas o la zona posterior del muslo.  Espasmos en los msculos de la espalda.  Hinchazn a lo largo de los msculos de la espalda.  Prdida de la fuerza en estos msculos.  Ruido de "crack" (crepitacin) al tocarlos. CAUSAS  La distensin se produce cuando se coloca una fuerza en el msculo o tendn que es mayor de lo que puede soportar. Las causas ms frecuentes de la lesin son:  Benedetto Goad prolongado de la unidad msculo - tendn en la zona de la cintura, generalmente debido a Landscape architect.  Una nica lesin o fuerza violenta aplicada en la espalda. LOS RIESGOS AUMENTAN CON  Cualquier deporte en el que el movimiento ocasione una fuerza de giro en la espalda o una gran inclinacin de la cintura; (ftbol americano, rugby, levantamiento de pesas, bowling, golf, tenis, patinaje, deportes con raqueta, natacin, carrera,  gimnasia, clavados).  Poca fuerza y flexibilidad.  No hacer un precalentamiento adecuado.  Historia familiar de dolor de cintura o trastornos en discos.  Cirugas previas en la espalda (especialmente fusin).  Tcnica incorrecta al levantar objetos pesados.  Permanecer largo tiempo sentado, especialmente de manera incorrecta. MEDIDAS DE PREVENCIN  Aprenda y Child psychotherapist correcta al sentarse o al levantarse (mantener la postura correcta al sentarse; levantar objetos inclinando las rodillas y las piernas y no la cintura).  Precalentamiento adecuado y elongacin antes de la [place].  Descanso y recuperacin entre actividades.  Mantener la forma fsica:  Earma [place], flexibilidad y resistencia muscular.  Capacidad cardiovascular. PRONSTICO Si se trata adecuadamente, generalmente es curable dentro de las 6 semanas. POSIBLES COMPLICACIONES:  La recurrencia frecuente de los sntomas puede dar como resultado un problema crnico.  La inflamacin crnica, degeneracin cicatrizal del tendn y ruptura parcial del tendn.  Demora de la curacin o de la resolucin de los sntomas.  Discapacidad prolongada. CONSIDERACIONES [employer] Corporation PARA EL TRATAMIENTO El tratamiento inicial incluye el uso de medicamentos y la aplicacin de hielo para reducir Chief Technology Officer y la inflamacin. Los ejercicios de elongacin y fortalecimiento pueden ayudar a reducir Chief Technology Officer con la [place]. Los ejercicios pueden Management consultant o con un terapeuta. Los Liz Claiborne graves pueden requerir la derivacin a un fisioterapeuta para Magazine features editor evaluacin y Games developer un tratamiento, como ultrasonido. El profesional podr indicarle el uso de un dispositivo ortopdico para ayudar a Glass blower/designer y la inflamacin. A menudo, demasiado reposo en cama podr resultar en ms daos que beneficios. Podrn prescribirle inyecciones de corticoides. Sin embargo, esto deber reservarse para los casos ms graves. Es Production designer, theatre/television/film uso de la espalda cuando se levantan objetos. Por la noche, se aconseja que usted [place], sobre un [address] y coloque una almohada debajo de las rodillas. Si no se obtiene xito con Artist, ser necesario someterse a [place].  MEDICAMENTOS   Si es necesaria la administracin de medicamentos para Chief Technology Officer, se recomiendan los antiinflamatorios no esteroides, como aspirina e ibuprofeno y otros calmantes menores, como acetaminofeno.  No tome medicamentos para el dolor dentro de los [address] previos a la [place].  El profesional podr prescribirle calmantes si lo considera necesario. Utilcelos como se le indique y slo cuando lo necesite.  Podr beneficiarse con Teachers Insurance and Annuity Association.  En algunos casos se indica una inyeccin de corticosteroides. Estas inyecciones deben reservarse para los [place] graves, porque slo se pueden Nurse, adult  determinada cantidad de veces. CALOR Y FRO   El fro (con hielo) debe aplicarse durante 10 a 15 minutos cada 2  3 horas para reducir la inflamacin y Chief Technology Officer e inmediatamente despus de cualquier actividad que agrava los sntomas. Utilice bolsas o un masaje de hielo.  El calor puede usarse antes de Therapist, music y de las actividades de fortalecimiento indicadas por el profesional, le fisioterapeuta o Orthoptist. Utilice una bolsa trmica o un pao hmedo. SOLICITE ATENCIN MDICA SI:   Los sntomas empeoran o no mejoran en 2 a 4 semanas, an realizando Pharmacist, community.  Siente adormecimiento, debilitamiento o prdida de control de la vejiga o el intestino.  Desarrolla nuevos e inexplicables sntomas. (Los medicamentos indicados en el tratamiento le ocasionan efectos secundarios). EJERCICIOS  EJERCICIOS DE AMPLITUD DE MOVIMIENTOS Cherlynn June Distensin de cintura La mayora de las personas con dolor de espalda baja encuentran que sus sntomas empeoran al doblarse hacia adelante (flexin) o al arquear la regin  inferior de la espalda (extensin). Los ejercicios que le ayudarn a Oncologist sus sntomas se Research scientist (life sciences).  El mdico, fisioterapeuta o Research scientist (physical sciences) ayudarn a Chief Strategy Officer qu ejercicios sern de ayuda para resolver su dolor de espalda. No realice ningn ejercicio sin consultarlo antes con el profesional. Discontinue los ejercicios que empeoran sus sntomas, hasta que hable con el mdico. Si siente dolor, entumecimiento u hormigueo que [employer] glteos, piernas o pies, el objetivo de esta terapia es que estos sntomas se acerquen a la espalda y Customer service manager. A veces, estos sntomas en las piernas mejoran, pero el dolor de espalda empeora. Este suele ser un indicio de progreso en su rehabilitacin. Asegrese de que estar atento a cualquier cambio en sus sntomas y las actividades que ha [employer]	[place] 24 horas antes del cambio. Compartir esta informacin con su mdico le permitir mayor eficiencia para tratar su enfermedad.  Estos ejercicios le ayudarn en la recuperacin de la lesin. Los sntomas podrn aliviarse con o sin asistencia adicional de su mdico, fisioterapeuta o Herbalist. Al completar estos ejercicios, recuerde:  Restaurar la flexibilidad del tejido ayuda a que las articulaciones recuperen el movimiento normal. Esto permite que el movimiento y la actividad sea ms saludables y menos dolorosos.  Para que sea efectiva, cada elongacin debe realizarse durante al menos 30 segundos.  La elongacin nunca debe ser dolorosa. Deber sentir slo un alargamiento o distensin suave del tejido que estira. EJERCICIOS DE AMPLITUD DE MOVIMIENTOS Y ELONGACIN: ELONGACION Flexin - una rodilla al pecho  Recustese en una cama dura o sobre el piso, con ambas piernas extendidas al frente.  Manteniendo una pierna en contacto con el piso, lleve la rodilla opuesta al pecho. Mantenga la pierna en esa posicin, sostenindola por la zona posterior del muslo o por  la rodilla.  Presione hasta sentir un suave estiramiento en la cintura. Mantenga esta posicin durante __________ segundos.  Libere la pierna lentamente y repita el ejercicio con el lado opuesto. Reptalo __________ veces. Realice este ejercicio __________ veces por da.  ELONGACIN Flexin, dos rodillas al pecho   Recustese en una cama dura o sobre el piso, con ambas piernas extendidas al frente.  Manteniendo una pierna en contacto con el piso, lleve la rodilla opuesta al pecho.  Tense los msculos del estmago para apoyar la espalda y levante la otra rodilla [place]. Mantenga las piernas en su lugar y tmese por detrs de las caderas o las rodillas.  Con ambas rodillas en el  pecho, tire hasta que sienta un estiramiento en la parte trasera de la espalda. Mantenga esta posicin durante __________ segundos.  Tense los msculos del estmago y baje las piernas de a una por vez. Reptalo __________ veces. Realice este ejercicio __________ veces por da.  ELONGACIN Rotacin de la zona baja del tronco  Recustese sobre una cama firme o sobre el suelo. Mantenga las piernas al frente, doble las rodillas de modo que ambas apunten hacia el techo y los pies queden bien apoyados en el piso.  Extienda los brazos a Dance movement psychotherapist. Esto estabilizar la zona superior del cuerpo, manteniendo los hombros en contacto con el piso.  Suave y lentamente deje caer ambas rodillas juntas hacia un lado, hasta sentir un ligero estiramiento en la cintura. Mantenga esta posicin durante __________ segundos.  Tensione los [employer] Corporation del estmago para Occupational psychologist la cintura mientras lleva las rodillas nuevamente a la posicin inicial. Repita el ejercicio hacia el otro lado. Reptalo __________ veces. Realice este ejercicio __________ veces por da. EJERCICIOS DE AMPLITUD DE MOVIMIENTOS Y FLEXIBILIDAD: ELONGACIN Extensin posicin prona sobre los codos  Acustese sobre el estmago sobre el piso, una cama ser muy  blanda. Coloque las palmas a una distancia igual al ancho de los hombres y a la altura de la cabeza.  Coloque los codos bajo los hombros. Si siente dolor, colquese almohadas debajo del pecho.  Deje que su cuerpo se relaje, de modo que las caderas queden ms abajo y tengan ms contacto con el piso.  Mantenga esta posicin durante __________ segundos.  Vuelva lentamente a la posicin plana sobre el piso. Reptalo __________ veces. Realice este ejercicio __________ veces por da.  AMPLITUD DE MOVIMIENTOS - Extensin - flexin de brazos en posicin prona  Acustese sobre el [employer]	[place] piso, una cama ser [place]. Coloque las palmas a una distancia igual al ancho de los hombres y a la altura de la cabeza.  Mantenga la espalda tan relajada como pueda, enderece lentamente los codos [address] las caderas contra el suelo. Puede modificar la posicin de las manos para estar ms cmodo. A medida que gana movimiento, sus manos quedarn ms por debajo de los hombros.  Mantenga cada posicin durante __________segundos.  Vuelva lentamente a la posicin plana sobre el piso. Reptalo __________ veces. Realice este ejercicio __________ veces por da.  AMPLITUD DE MOVIMIENTOS Cuadrpedo [place] vertebral neutral  Coloque las manos y las rodillas en una superficie firme. Las manos deben quedar a la altura de los hombros y las rodillas debajo de las caderas. Puede colocar algo debajo las rodillas para estar ms cmodo.  Haga caer la cabeza y apunte el cccix hacia el suelo debajo de usted. De este modo se redondear la cintura, en [place] similar a un gato enojado. Mantenga esta posicin durante __________ segundos.  Lentamente levante la cabeza y afloje el cccix para que se hunda el cuerpo en un gran arco, como un caballo.  Mantenga esta posicin durante __________ segundos.  Reptalo hasta sentir calor en la cintura.  Ahora encuentre su "punto ideal". Ser la posicin ms cmoda  [employer]. En esta posicin es cuando su columna est neutral. Una vez que encuentre la posicin, tensione los msculos del estmago para sostener la zona inferior de la espalda.  Mantenga esta posicin durante __________ segundos. Reptalo __________ veces. Realice este ejercicio __________ veces por da.  EJERCICIOS DE FORTALECIMIENTO - Distensin de la cintura Estos ejercicios le ayudarn en la recuperacin de la lesin. Estos  ejercicios deben hacerse cerca de su "punto dulce". Este es el arco neutro, de la parte baja de la espalda, en algn lugar entre la posicin completamente redondeada y arqueada plenamente, que es la posicin menos dolorosa. Cuando se realiza en [employer] de seguridad del movimiento, estos ejercicios se pueden Chemical engineer para las personas que tienen una lesin basada en flexin o extensin. Con estos ejercicios, los sntomas podrn desaparecer con o sin mayor intervencin del profesional, el fisioterapeuta o Orthoptist. Al completar estos ejercicios, recuerde:   Los msculos pueden ganar la resistencia y la fuerza necesarias para las actividades diarias a travs de ejercicios controlados.  Realice los ejercicios como se lo indic el mdico, el fisioterapeuta o Orthoptist. Aumente la resistencia y las repeticiones segn se le haya indicado.  Podr experimentar dolor o cansancio muscular, pero el dolor o molestia que trata de eliminar a travs de los ejercicios nunca debe empeorar. Si el dolor empeora, detngase y asegrese de que est siguiendo las directivas correctamente. Si an siente dolor luego de Education officer, environmental lo ajustes necesarios, deber discontinuar el ejercicio hasta que pueda conversar con el profesional sobre el problema. FORTALECIMIENTO - Abdominales profundos - Inclinacin plvica  Recustese sobre una cama firme o sobre el suelo. Mantenga las piernas al frente, doble las rodillas de modo que ambas apunten hacia el techo y los pies queden  bien apoyados en el piso.  Tensione la zona baja de los msculos abdominales para presionar la Oncologist. Este movimiento har rotar su pelvis de modo que el cccix quede hacia arriba y no apuntando a los pies o hacia el piso.  Con una tensin suave y respiracin pareja, mantenga esta posicin durante __________ segundos. Reptalo __________ veces. Realice este ejercicio __________ veces por da.  FORTALECIMIENTO Abdominales encogimiento abdominal  Recustese sobre una cama firme o sobre el suelo. Mantenga las piernas al frente, doble las rodillas de modo que ambas apunten hacia el techo y los pies queden bien apoyados en el piso. Cruce las [employer].  Apunte suavemente con la barbilla hacia abajo, sin doblar el cuello.  Tensione los abdominales y eleve lentamente el tronco la altura suficiente para despegar los omplatos. Si se eleva ms, pondr tensin excesiva en la cintura y esto no fortalecer ms los abdominales.  Controle la vuelta a la posicin inicial. Reptalo __________ veces. Realice este ejercicio __________ veces por da.  EN CUATRO MIEMBROS Cuadrpedo, elevacin de miembro superior e inferior opuestos   [employer] y las rodillas en una superficie firme. Las manos deben quedar a la altura de los hombros y las rodillas debajo de las caderas. Puede colocar algo debajo las rodillas para estar ms cmodo.  Encuentre la posicin neutral de la columna vertebral y Public house manager los msculos abdominales de modo que pueda mantener esta posicin. Los hombros y las caderas deben formar un rectngulo paralelo con el suelo y recto.  Manteniendo el tronco firme, eleve la mano derecha a la altura del hombro y luego eleve la pierna izquierda a la altura de la cadera. Asegrese de no contener la respiracin. Mantenga cada posicin durante __________ segundos.  Con los msculos abdominales en tensin y la espalda firme, vuelva lentamente a la posicin  inicial. Repita con el otro brazo y la otra pierna. Reptalo __________ veces. Realice este ejercicio __________ veces por da.  FORTALECIMIENTO Abdominales bajos Elevacin de ambas rodillas  Recustese sobre una cama firme o sobre el suelo. Mantenga las piernas al frente, doble  las rodillas de modo que ambas apunten hacia el techo y los pies queden bien apoyados en el piso.  Tensione los msculos abdominales que se encuentran en la cintura y eleve lentamente ambas rodillas hasta que queden sobre las caderas. Asegrese de no contener la respiracin.  Mantenga esta posicin durante __________ segundos. Usando los abdominales, regrese a la posicin inicial en un modo lento y controlado. Reptalo __________ veces. Realice este ejercicio __________ veces por da.  CONSIDERACIONES ACERCA DE LA POSTURA Y LA MECNICA DEL CUERPO Ditensin de la cintura Si mantiene una postura correcta cuando se encuentre de pie, sentado o realizando sus actividades, reducir el estrs Teachers Insurance and Annuity Association diferentes tejidos del cuerpo, y Acupuncturist a los tejidos lesionados la posibilidad de curarse y Film/video editor las experiencias dolorosas. A continuacin se indican pautas generales para mejorar la postura- Su mdico o fisioterapeuta le dar instrucciones especficas segn sus necesidades. Al leer estas pautas recuerde:  Los ejercicios indicados por su mdico lo ayudarn a Chartered certified accountant flexibilidad y la fuerza para Pharmacologist las posturas correctas.  La postura correcta proporciona el mejor entorno de trabajo para las articulaciones. Las articulaciones se desgastan menos cuando estn sostenidas adecuadamente por una columna vertebral en buena postura. Esto significa que su cuerpo estar ms sano y Research officer, trade union.  La correcta postura debe practicarse en todas las actividades, especialmente al estar sentado o de pie durante [place]. Tambin es importante al realizar actividades repetitivas de bajo estrs (tipeo) o una actividad nica y  pesada. POSICIONES DE Dellie Catholic Tenga en cuenta cules son las posturas que ms dolor le causan al elegir una posicin de descanso. Si siente dolor con las actividades en que deba realizar una flexin (sentarse, inclinarse, detenerse, ponerse en cuclillas), elija una posicin que le permita descansar en una postura menos flexionada. Evite curvarse en posicin fetal cuando se encuentre de lado. Si el dolor empeora con las actividades basadas en la extensin (estar de pie durante un tiempo prolongado, trabajar con las manos por arriba de la cabeza) evite descansar en Neomia Dear posicin extendida durante mucho tiempo, como dormir sobre el [place]. La [employer] de las Psychologist, forensic cmodo el descanso sobre la columna vertebral en una posicin neutral, ni muy redondeada ni [place]. Recustese sobre su lado en una cama que no est hundida con una almohada entre las rodillas o sobre la espalda con una almohada bajo las rodillas, y sentir [place]. Tenga en cuenta que cualquier posicin en [employer], no importa si es una postura [place], puede provocarle rigidez. POSTURAS CORRECTAS PARA SENTARSE Con el fin de minimizar el estrs y Environmental health practitioner en su columna, deber sentarse con la postura correcta. Sentarse con una buena postura debe ser algo sin esfuerzo para un cuerpo sano. Recuperar una buena postura es un proceso gradual. Muchas personas pueden trabajar ms cmodas mediante el uso de diferentes soportes hasta que tengan la flexibilidad y la fuerza para mantener esta postura por su cuenta. Al sentarse con la [employer], los odos deben estar sobre los hombros y los hombros sobre las caderas. Debe utilizar el respaldo de la silla para apoyar la espalda. La espalda estar en una posicin neutral, ligeramente arqueada. Puede colocar una pequea almohada o toalla doblada en la base de la espalda baja para apoyo.  Si trabaja en un escritorio, cree un ambiente que le proporciones un buen soporte y [place]. Sin apoyo adicional, msculos se cansan, lo que lleva a una tensin excesiva en las articulaciones y otros tejidos. Tenga en  cuenta estas recomendaciones: SILLA:   La silla debe poder deslizarse por debajo del escritorio cuando su espalda tome contacto con el respaldo. Esto le permitir trabajar ms cerca.  La altura de la silla debe permitirle que los ojos tengan el nivel de la parte superior del monitor y las manos estn ms abajo que los codos. POSICIN DEL CUERPO  Los pies deben tener contacto con el piso. Si no es posible, use un posapies.  Mantenga las [employer] hombros. Esto reducir el estrs en el cuello y en la cintura. POSTURAS INCORRECTAS PARA SENTARSE  Si se siente cansado e incapaz de asumir una postura sentada sana, no se eche hacia atrs. Esto pone una tensin excesiva en los tejidos de su espalda, y causa ms dao y Engineer, mining. Entre las opciones ms saludables se incluyen:  El uso de ms apoyo, como una almohada lumbar.  Cambio de tareas, a algo que demande una posicin vertical o caminar.  Tomar una breve caminata.  Recostarse y Theatre stage manager posicin neutral. DE PIE DURANTE UN TIEMPO PROLONGADO E INCLINADO LIGERAMENTE HACIA ADELANTE Cuando deba realizar una tarea que requiera inclinacin hacia adelante estando de pie en el mismo sitio durante mucho tiempo, coloque un pie en un objeto de 2 a 4 pulgadas de alto, para [employer]. Cuando ambos pies estn en el piso, la zona inferior de la espalda tiene a perder su ligera curvatura hacia adentro. Si esta curva se aplana (o se pronuncia demasiado) la espalda y las articulaciones experimentarn demasiado estrs, se fatigarn ms rpidamente y Geophysicist/field seismologist. POSTURAS CORRECTAS PARA ESTAR DE PIE Una postura adecuada de pie realizarse en todas las actividades diarias, incluso si slo toman un momento, como al [employer]. Como en la postura de sentado, los odos deben estar sobre  los hombros y los hombros sobre las caderas. Deber mantener una ligera tensin en sus msculos abdominales para asegurar la columna vertebral. El cccix debe apuntar hacia el suelo, no detrs de su cuerpo, ya que resultara en una curvatura de la espalda sobre-extendida.  POSTURAS INCORRECTAS PARA ESTAR DE PIE Las posturas incorrectas para estar de pie incluyen tener la cabeza hacia delante, las rodillas bloqueadas o una excesiva curvatura de la espalda. CAMINAR Camine en Deborha Payment erguida. [place], hombros y caderas deben estar alineados. ACTIVIDAD PROLONGADA EN UNA POSICIN FLEXIONADA Al completar una tarea que requiere que se doble la cintura hacia adelante o inclinarse sobre una superficie baja, trate de encontrar una manera de estabilizar 3 de cada 4 de sus miembros. Puede colocar una mano o el codo en el [place], o descansar una rodilla en la superficie en la que est apoyado. Esto le proporcionar ms estabilidad para que sus msculos no se cansen tan rpidamente. El [employer] rodillas [place], o ligeramente dobladas, tambin reducir el estrs en la espalda baja. TCNICAS CORRECTAS PARA LEVANTAR OBJETOS SI:   Asumir una postura amplia. Esto le proporcionar ms estabilidad y la oportunidad de acercarse lo ms posible al objeto que se est levantando.  Tense los abdominales para asegurar la columna vertebral. Doble las rodillas y las caderas. Manteniendo la espalda en una posicin neutral, haga el esfuerzo con los msculos de la pierna. Levntese con las piernas, manteniendo la espalda derecha.  Pruebe el peso de los objetos desconocidos antes de tratar de Secondary school teacher.  Trate de [employer] codos hacia abajo y a los lados, con el fin de obtener la fuerza de los hombros al llevar un objeto.  Siempre pida ayuda a otra persona cuando deba levantar objetos pesados o incmodos. TCNICAS INCORRECTAS PARA LEVANTAR OBJETOS NO:   Bloquee rodillas al levantar, aunque sea un objeto  pequeo.  Se doble ni gire. Gire sobre los pies ni los mueva cuando necesite cambiar de direccin.  Considere que no puede levantar incluso un clip de papel con seguridad, sin Clinical biochemistuna postura correcta.   Esta informacin no tiene Theme park managercomo fin reemplazar el consejo del mdico. Asegrese de hacerle al mdico cualquier pregunta que tenga.   Document Released: 03/17/2006 Document Revised: 10/15/2014 Elsevier Interactive Patient Education [employer]2016 Elsevier Inc.

## 2015-11-16 LAB — GC/CHLAMYDIA PROBE AMP
CT PROBE, AMP APTIMA: NOT DETECTED
GC PROBE AMP APTIMA: NOT DETECTED

## 2015-11-17 ENCOUNTER — Other Ambulatory Visit (HOSPITAL_BASED_OUTPATIENT_CLINIC_OR_DEPARTMENT_OTHER): Payer: Self-pay

## 2015-11-17 ENCOUNTER — Ambulatory Visit (HOSPITAL_BASED_OUTPATIENT_CLINIC_OR_DEPARTMENT_OTHER)
Admission: RE | Admit: 2015-11-17 | Discharge: 2015-11-17 | Disposition: A | Discharge status: Home / Self Care | Payer: 59 | Source: Ambulatory Visit | Attending: Physician Assistant | Admitting: Physician Assistant

## 2015-11-17 DIAGNOSIS — N503 Cyst of epididymis: Secondary | ICD-10-CM | POA: Insufficient documentation

## 2015-11-17 DIAGNOSIS — N50812 Left testicular pain: Secondary | ICD-10-CM | POA: Insufficient documentation

## 2015-11-17 NOTE — Addendum Note (Signed)
Addended by: Morrell RiddleWEBER, Amen Dargis L on: 11/17/2015 08:48 AM   Modules accepted: Orders

## 2015-11-25 ENCOUNTER — Telehealth: Payer: Self-pay | Admitting: Emergency Medicine

## 2015-11-25 NOTE — Telephone Encounter (Signed)
-----   Message from Shade FloodJeffrey R Greene, MD sent at 11/24/2015 10:44 AM EDT ----- Call patient. Ultrasound did not show any infection in the testicles.  There were a few cysts in the epididymis, but can discuss at planned follow-up. I saw his note that he is still symptomatic and plans on seeing me today. Can discuss these further at that time.

## 2015-11-25 NOTE — Telephone Encounter (Signed)
Left message for pt to call for lab results 

## 2015-11-26 ENCOUNTER — Ambulatory Visit (INDEPENDENT_AMBULATORY_CARE_PROVIDER_SITE_OTHER): Payer: 59 | Admitting: Physician Assistant

## 2015-11-26 VITALS — BP 128/84 | HR 70 | Temp 98.0°F | Resp 18 | Ht 68.0 in | Wt 212.4 lb

## 2015-11-26 DIAGNOSIS — N509 Disorder of male genital organs, unspecified: Secondary | ICD-10-CM | POA: Diagnosis not present

## 2015-11-26 DIAGNOSIS — N50819 Testicular pain, unspecified: Secondary | ICD-10-CM

## 2015-11-26 MED ORDER — NAPROXEN 500 MG PO TBEC: 500.0000 mg | DELAYED_RELEASE_TABLET | Freq: Two times a day (BID) | ORAL | Status: AC

## 2015-11-26 NOTE — Patient Instructions (Signed)
     IF you received an x-ray today, you will receive an invoice from [facility]. Please contact [facility] at [phone] with questions or concerns regarding your invoice.   IF you received labwork today, you will receive an invoice from [facility]. Please contact [facility] at [phone] with questions or concerns regarding your invoice.   Our billing staff will not be able to assist you with questions regarding bills from these companies.  You will be contacted with the lab results as soon as they are available. The fastest way to get your results is to activate your My Chart account. Instructions are located on the last page of this paperwork. If you have not heard from us regarding the results in 2 weeks, please contact this office.      

## 2015-11-26 NOTE — Progress Notes (Signed)
   11/26/2015 7:57 PM   DOB: 1976/02/20 / MRN: [id number]  SUBJECTIVE:  Tommy Rosario is a 40 y.o. male presenting for a follow up of testicular irritation and swelling.  He has had an ultrasound and venous doppler to evaluate this problem within the last two weeks and results show two small cyst that were considered benign by radiology.  Patient is worried and continues to have mild pain about the cyst.  He is amenable to urology consult.   He has No Known Allergies.   He  has no past medical history on file.    He  reports that he has never smoked. He does not have any smokeless tobacco history on file. He reports that he does not drink alcohol or use illicit drugs. He  has no sexual activity history on file. The patient  has no past surgical history on file.  His family history is not on file.  Review of Systems  Constitutional: Negative for fever and chills.  Genitourinary: Negative for dysuria.  Skin: Negative for rash.  Neurological: Negative for dizziness and headaches.    Problem list and medications reviewed and updated by myself where necessary, and exist elsewhere in the encounter.   OBJECTIVE:  BP 128/84 mmHg  Pulse 70  Temp(Src) 98 F (36.7 C) (Oral)  Resp 18  Ht 5\' 8"  (1.727 m)  Wt 212 lb 6.4 oz (96.344 kg)  BMI 32.30 kg/m2  SpO2 96%  Physical Exam  Constitutional: He is oriented to person, place, and time. He appears well-developed. He does not appear ill.  Eyes: Conjunctivae and EOM are normal. Pupils are equal, round, and reactive to light.  Cardiovascular: Normal rate.   Pulmonary/Chest: Effort normal.  Abdominal: He exhibits no distension.  Genitourinary:     Musculoskeletal: Normal range of motion.  Neurological: He is alert and oriented to person, place, and time. No cranial nerve deficit. Coordination normal.  Skin: Skin is warm and dry. He is not diaphoretic.  Psychiatric: He has a normal mood and affect.  Nursing note and vitals  reviewed.   No results found for this or any previous visit (from the past 72 hour(s)).  No results found.  ASSESSMENT AND PLAN  Tommy Rosario was seen today for follow-up.  Diagnoses and all orders for this visit:  Persistent testicular pain: See the ultrasound.  Patient is very concerned despite benign imaging.  He will see a urologists in the next month for consult and further options.  -     naproxen (EC-NAPROSYN) 500 MG EC tablet; Take 1 tablet (500 mg total) by mouth 2 (two) times daily with a meal. -     Ambulatory referral to Urology    The patient was advised to call or return to clinic if he does not see an improvement in symptoms or to seek the care of the closest emergency department if he worsens with the above plan.   Deliah BostonMichael Clark, MHS, PA-C [facility] 11/26/2015 7:57 PM

## 2016-12-16 ENCOUNTER — Encounter: Payer: Self-pay | Admitting: Emergency Medicine

## 2016-12-16 ENCOUNTER — Ambulatory Visit (INDEPENDENT_AMBULATORY_CARE_PROVIDER_SITE_OTHER): Payer: 59 | Admitting: Emergency Medicine

## 2016-12-16 VITALS — BP 128/80 | HR 83 | Temp 98.6°F | Resp 18 | Ht 66.0 in | Wt 223.4 lb

## 2016-12-16 DIAGNOSIS — M791 Myalgia: Secondary | ICD-10-CM

## 2016-12-16 DIAGNOSIS — M545 Low back pain, unspecified: Secondary | ICD-10-CM | POA: Insufficient documentation

## 2016-12-16 DIAGNOSIS — M62830 Muscle spasm of back: Secondary | ICD-10-CM | POA: Diagnosis not present

## 2016-12-16 DIAGNOSIS — M7918 Myalgia, other site: Secondary | ICD-10-CM

## 2016-12-16 LAB — POCT URINALYSIS DIP (MANUAL ENTRY)
Bilirubin, UA: NEGATIVE
Blood, UA: NEGATIVE
GLUCOSE UA: NEGATIVE mg/dL
Ketones, POC UA: NEGATIVE mg/dL
LEUKOCYTES UA: NEGATIVE
NITRITE UA: NEGATIVE
SPEC GRAV UA: 1.015 (ref 1.010–1.025)
UROBILINOGEN UA: 0.2 U/dL
pH, UA: 8 (ref 5.0–8.0)

## 2016-12-16 MED ORDER — CYCLOBENZAPRINE HCL 10 MG PO TABS: 10.0000 mg | ORAL_TABLET | Freq: Three times a day (TID) | ORAL | 0 refills | Status: AC | PRN

## 2016-12-16 MED ORDER — DICLOFENAC SODIUM 75 MG PO TBEC: 75.0000 mg | DELAYED_RELEASE_TABLET | Freq: Two times a day (BID) | ORAL | 0 refills | Status: AC

## 2016-12-16 NOTE — Patient Instructions (Addendum)
IF you received an x-ray today, you will receive an invoice from [facility]. Please contact [facility] at [phone] with questions or concerns regarding your invoice.   IF you received labwork today, you will receive an invoice from [facility]. Please contact [facility] at [phone] with questions or concerns regarding your invoice.   Our billing staff will not be able to assist you with questions regarding bills from these companies.  You will be contacted with the lab results as soon as they are available. The fastest way to get your results is to activate your My Chart account. Instructions are located on the last page of this paperwork. If you have not heard from [place] regarding the results in 2 weeks, please contact this office.     Distensin lumbar con rehabilitacin (Low Back Strain With Rehab) Una distensin es un estiramiento o un desgarro en un msculo o los fuertes cordones de tejido que adhieren el msculo al hueso (tendones). Las distensiones en la parte inferior de la espalda (columna lumbar) son Neomia Dear causa frecuente de dolor lumbar. Una distensin ocurre cuando los msculos o tendones se desgarran o se estiran ms all de su lmite. Los msculos se pueden inflamar y, por lo tanto, Development worker, international aid dolor y Engineer, materials repentina del msculo (espasmos). Una distensin puede ocurrir de repente debido a una lesin (traumatismo) o se Airline pilot gradualmente debido al [employer]. Hay tres tipos de distensiones:  El grado1 es una distensin leve que se caracteriza por un Hydrologist de las fibras o tendones musculares. Esto puede provocar un poco de dolor, pero no hay prdida de fuerza muscular.  El grado2 es una distensin moderada producida por un desgarro parcial de las fibras o tendones musculares. Esto provoca un dolor ms intenso y cierta prdida de fuerza muscular.  El grado3 es una distensin grave e implica la ruptura completa del msculo o del  tendn. Esto causa un dolor intenso y la prdida completa o casi completa de la fuerza muscular. CAUSAS Esta afeccin puede ser causada por lo siguiente:  Traumatismo, debido, por ejemplo, a una cada o a un golpe en el cuerpo.  Torsin o hiperdistensin de la espalda. Esto puede ser el resultado de realizar actividades que requieren mucha energa, como levantar objetos pesados. FACTORES DE RIESGO Los siguientes factores pueden aumentar el riesgo de sufrir esta afeccin:  Education administrator deportes de contacto.  Participar en deportes o actividades que sobrecargan la espalda e implican mucha flexin y torsin, por ejemplo: ? Levantar pesas u objetos pesados. ? Gimnasia. ? Ftbol. ? Patinaje artstico. ? Snowboard.  Tener exceso de [place] u obesidad.  Tener poca fuerza y flexibilidad. SNTOMAS Los sntomas de esta afeccin pueden incluir los siguientes:  Dolor agudo o sordo en la parte inferior de la espalda que no desaparece. El dolor se puede extender [employer].  Rigidez.  Amplitud de movimientos limitada.  Incapacidad para pararse derecho debido a la rigidez o al [employer].  Espasmos musculares. DIAGNSTICO Esta afeccin se puede diagnosticar en funcin de lo siguiente:  Sus sntomas.  Sus antecedentes mdicos.  Un examen fsico. ? El mdico puede presionar sobre ciertas zonas de la espalda para determinar el origen de su dolor. ? Le puede pedir que se incline [place], [place] atrs y de un lado al otro para Development worker, community la intensidad del dolor y la amplitud de [employer].  Pruebas de diagnstico por imgenes, como: ? Radiografas. ? Resonancia magntica (RM). TRATAMIENTO El tratamiento de esta afeccin puede incluir  lo siguiente:  Aplicar calor y fro en la zona afectada.  Tomar medicamentos para Engineer, materialsaliviar el dolor y International aid/development workerrelajar los msculos (relajantes musculares).  Tomar antiinflamatorios no esteroides Murriel Hopper(AINE) para ayudar a Building services engineerreducir la hinchazn y las  molestias.  Realizar fisioterapia. Cuando los sntomas mejoran, es importante volver a su rutina habitual tan pronto como sea posible para reducir Chief Technology Officerel dolor, y Automotive engineerevitar la rigidez y la prdida de fuerza muscular. En general, los sntomas deberan mejorar en 6semanas de tratamiento. Sin embargo, el tiempo de recuperacin vara. INSTRUCCIONES PARA EL CUIDADO EN EL HOGAR Control del dolor, de la rigidez y de la hinchazn  Si se lo indic el mdico, aplique hielo en la zona afectada durante las primeras 24horas despus de la lesin. ? Ponga el hielo en una bolsa plstica. ? Coloque una FirstEnergy Corptoalla entre la piel y la bolsa de hielo. ? Coloque el hielo durante 20minutos, 2 a 3veces por da.  Si se lo indican, aplique calor en la zona afectada tan frecuentemente como se lo haya indicado el mdico. Use la fuente de calor que el mdico le recomiende, como una compresa de calor hmedo o una almohadilla trmica. ? Coloque una FirstEnergy Corptoalla entre la piel y la fuente de Airline pilotcalor. ? Aplique el calor durante 20 a 30minutos. ? Retire la fuente de calor si la piel se le pone de color rojo brillante. Esto es muy importante si no puede sentir el dolor, el calor o el fro. Puede correr un riesgo mayor de sufrir quemaduras. Actividad  Descanse y retome sus actividades normales como se lo haya indicado el mdico. Pregntele al mdico qu actividades son seguras para usted.  Evite las actividades que demandan mucho esfuerzo (que son extenuantes) durante el tiempo que le haya indicado el mdico.  Haga ejercicios como se lo haya indicado el mdico. Instrucciones generales  CenterPoint Energyome los medicamentos de venta libre y los recetados solamente como se lo haya indicado el mdico.  Si tiene alguna pregunta o inquietud sobre la seguridad mientras toma analgsicos, hable con el mdico.  No conduzca ni opere maquinaria pesada hasta saber cmo lo afectan los analgsicos.  No consuma ningn producto que contenga tabaco, lo que incluye  cigarrillos, tabaco de Theatre managermascar y Administrator, Civil Servicecigarrillos electrnicos. El tabaco puede retrasar el proceso de curacin. Si necesita ayuda para dejar de fumar, consulte al mdico.  Concurra a todas las visitas de control como se lo haya indicado el mdico. Esto es importante. PREVENCIN  Precaliente y elongue adecuadamente antes de la [place]actividad.  Reljese y elongue despus de realizar Nigeriauna actividad.  Dele a su cuerpo tiempo para Saks Incorporateddescansar entre los perodos de Cunardactividad.  Evite lo siguiente: ? Estar fsicamente inactivo durante perodos prolongados. ? Hacer ejercicio o practicar deportes cuando est cansado o dolorido.  Practique deportes y levante objetos pesados de la forma correcta.  Adopte una buena postura mientras est sentado y de pie.  Mantenga un peso saludable.  Duerma en un colchn de firmeza media para apoyar la columna.  Asegrese de Chemical engineerutilizar un equipo apto para usted, incluidos zapatos que Marathon Oilcalcen bien.  Tome medidas de seguridad y sea responsable al hacer Ether Griffinsuna actividad, para evitar las cadas.  Haga por lo menos 150minutos de ejercicios de intensidad moderada cada semana, como caminar a paso ligero o hacer gimnasia acutica. Pruebe alguna forma de ejercicio que le quite tensin de la espalda, como nadar o Chemical engineerutilizar la bicicleta fija.  Mantenga un buen estado fsico, esto incluye lo siguiente: ? La fuerza. ? La flexibilidad. ? La  capacidad cardiovascular. ? La resistencia.  SOLICITE ATENCIN MDICA SI:  El dolor de espalda no mejora despus de 6semanas de [place] Kenttratamiento.  Los sntomas empeoran.  SOLICITE ATENCIN MDICA DE INMEDIATO SI:  El dolor de espalda es muy intenso.  Nota que no puede pararse o caminar.  Siente dolor en las piernas.  Siente debilidad en las nalgas o en las piernas.  Tiene problemas para Scientist, physiologicalcontrolar la miccin o los movimientos intestinales.  Esta informacin no tiene Theme park managercomo fin reemplazar el consejo del mdico. Asegrese de hacerle al mdico  cualquier pregunta que tenga. Document Released: 03/17/2006 Document Revised: 10/15/2014 Document Reviewed: 03/12/2015 Elsevier Interactive Patient Education  2017 [employer]vier Inc.

## 2016-12-16 NOTE — Progress Notes (Signed)
Tommy Rosario 41 y.o.   Chief Complaint  Patient presents with  . Back Pain    x2 weeks on left side, per pt says it feels like it is pinching and per pt is having some discomfort when urinating.    HISTORY OF PRESENT ILLNESS: This is a 41 y.o. 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 No other significant symptoms.  Back Pain  This is a new problem. The current episode started 1 to 4 weeks ago. The problem occurs intermittently. The problem has been waxing and waning since onset. The pain is present in the lumbar spine. The quality of the pain is described as aching. The pain does not radiate. The pain is at a severity of 5/10. The pain is moderate. The symptoms are aggravated by position and twisting. Pertinent negatives include no abdominal pain, bladder incontinence, bowel incontinence, chest pain, dysuria, fever, headaches, leg pain, numbness, paresis, paresthesias, pelvic pain, perianal numbness, tingling or weakness. Risk factors: none.     Prior to Admission medications   Medication Sig Start Date End Date Taking? Authorizing Provider  naproxen (EC-NAPROSYN) 500 MG EC tablet Take 1 tablet (500 mg total) by mouth 2 (two) times daily with a meal. Patient not taking: Reported on 12/16/2016 11/26/15   Ofilia Neas, PA-C    No Known Allergies  There are no active problems to display for this patient.   No past medical history on file.  No past surgical history on file.  Social History   Social History  . Marital status: Married    Spouse name: N/A  . Number of children: N/A  . Years of education: N/A   Occupational History  . Not on file.   Social History Main Topics  . Smoking status: Never Smoker  . Smokeless tobacco: Never Used  . Alcohol use No  . Drug use: No  . Sexual activity: Not on file   Other Topics Concern  . Not on file   Social History Narrative  . No narrative on file    No  family history on file.   Review of Systems  Constitutional: Negative for chills and fever.  HENT: Negative.   Eyes: Negative.   Respiratory: Negative for cough and shortness of breath.   Cardiovascular: Negative for chest pain and palpitations.  Gastrointestinal: Negative for abdominal pain, bowel incontinence, diarrhea, nausea and vomiting.  Genitourinary: Negative for bladder incontinence, dysuria and pelvic pain.  Musculoskeletal: Positive for back pain. Negative for joint pain and neck pain.  Skin: Negative for rash.  Neurological: Negative for dizziness, tingling, weakness, numbness, headaches and paresthesias.  Endo/Heme/Allergies: Negative.   All other systems reviewed and are negative.  Vitals:   12/16/16 1550 12/16/16 1648  BP: (!) 141/79 128/80  Pulse: 83   Resp: 18   Temp: 98.6 F (37 C)      Physical Exam  Constitutional: He is oriented to person, place, and time. He appears well-developed.  HENT:  Head: Normocephalic and atraumatic.  Mouth/Throat: Oropharynx is clear and moist.  Eyes: Conjunctivae and EOM are normal. Pupils are equal, round, and reactive to light.  Neck: Normal range of motion.  Cardiovascular: Normal rate, regular rhythm and normal heart sounds.   Pulmonary/Chest: Effort normal and breath sounds normal.  Abdominal: Soft. Bowel sounds are normal. He exhibits no distension. There is no tenderness.  Musculoskeletal:       Lumbar back: He exhibits decreased range  of motion, tenderness and spasm. He exhibits no bony tenderness and normal pulse.       Back:  Neurological: He is alert and oriented to person, place, and time. He displays normal reflexes. No sensory deficit. He exhibits normal muscle tone. Coordination normal.  Skin: Skin is warm and dry. Capillary refill takes less than 2 seconds. No rash noted.  Psychiatric: He has a normal mood and affect. His behavior is normal.  Vitals reviewed.  Results for orders placed or performed in visit  on 12/16/16 (from the past 24 hour(s))  POCT urinalysis dipstick     Status: Abnormal   Collection Time: 12/16/16  4:10 PM  Result Value Ref Range   Color, UA yellow yellow   Clarity, UA cloudy (A) clear   Glucose, UA negative negative mg/dL   Bilirubin, UA negative negative   Ketones, POC UA negative negative mg/dL   Spec Grav, UA 1.610 9.604 - 1.025   Blood, UA negative negative   pH, UA 8.0 5.0 - 8.0   Protein Ur, POC trace (A) negative mg/dL   Urobilinogen, UA 0.2 0.2 or 1.0 E.U./dL   Nitrite, UA Negative Negative   Leukocytes, UA Negative Negative     ASSESSMENT & PLAN: Tommy Rosario was seen today for back pain.  Diagnoses and all orders for this visit:  Lumbar pain -     POCT urinalysis dipstick  Back muscle spasm -     cyclobenzaprine (FLEXERIL) 10 MG tablet; Take 1 tablet (10 mg total) by mouth 3 (three) times daily as needed for muscle spasms.  Musculoskeletal pain -     diclofenac (VOLTAREN) 75 MG EC tablet; Take 1 tablet (75 mg total) by mouth 2 (two) times daily.    Patient Instructions       IF you received an x-ray today, you will receive an invoice from [facility]. Please contact [facility] at [phone] with questions or concerns regarding your invoice.   IF you received labwork today, you will receive an invoice from [facility]. Please contact [facility] at [phone] with questions or concerns regarding your invoice.   Our billing staff will not be able to assist you with questions regarding bills from these companies.  You will be contacted with the lab results as soon as they are available. The fastest way to get your results is to activate your My Chart account. Instructions are located on the last page of this paperwork. If you have not heard from [place] regarding the results in 2 weeks, please contact this office.     Distensin lumbar con rehabilitacin (Low Back Strain With Rehab) Una distensin es un estiramiento o un desgarro  en un msculo o los fuertes cordones de tejido que adhieren el msculo al hueso (tendones). Las distensiones en la parte inferior de la espalda (columna lumbar) son Neomia Dear causa frecuente de dolor lumbar. Una distensin ocurre cuando los msculos o tendones se desgarran o se estiran ms all de su lmite. Los msculos se pueden inflamar y, por lo tanto, Development worker, international aid dolor y Engineer, materials repentina del msculo (espasmos). Una distensin puede ocurrir de repente debido a una lesin (traumatismo) o se Airline pilot gradualmente debido al [employer]. Hay tres tipos de distensiones:  El grado1 es una distensin leve que se caracteriza por un Hydrologist de las fibras o tendones musculares. Esto puede provocar un poco de dolor, pero no hay prdida de fuerza muscular.  El grado2 es una distensin moderada producida por un desgarro parcial de las fibras  o tendones musculares. Esto provoca un dolor ms intenso y cierta prdida de fuerza muscular.  El grado3 es una distensin grave e implica la ruptura completa del msculo o del tendn. Esto causa un dolor intenso y la prdida completa o casi completa de la fuerza muscular. CAUSAS Esta afeccin puede ser causada por lo siguiente:  Traumatismo, debido, por ejemplo, a una cada o a un golpe en el cuerpo.  Torsin o hiperdistensin de la espalda. Esto puede ser el resultado de realizar actividades que requieren mucha energa, como levantar objetos pesados. FACTORES DE RIESGO Los siguientes factores pueden aumentar el riesgo de sufrir esta afeccin:  Education administratorracticar deportes de contacto.  Participar en deportes o actividades que sobrecargan la espalda e implican mucha flexin y torsin, por ejemplo: ? Levantar pesas u objetos pesados. ? Gimnasia. ? Ftbol. ? Patinaje artstico. ? Snowboard.  Tener exceso de Burchinalpeso u obesidad.  Tener poca fuerza y flexibilidad. SNTOMAS Los sntomas de esta afeccin pueden incluir los siguientes:  Dolor agudo o sordo en  la parte inferior de la espalda que no desaparece. El dolor se puede extender Omnicarehacia las nalgas.  Rigidez.  Amplitud de movimientos limitada.  Incapacidad para pararse derecho debido a la rigidez o al [employer].  Espasmos musculares. DIAGNSTICO Esta afeccin se puede diagnosticar en funcin de lo siguiente:  Sus sntomas.  Sus antecedentes mdicos.  Un examen fsico. ? El mdico puede presionar sobre ciertas zonas de la espalda para determinar el origen de su dolor. ? Le puede pedir que se incline Kellogghacia adelante, Whiteashhacia atrs y de un lado al otro para Development worker, communityevaluar la intensidad del dolor y la amplitud de Parkdalemovimiento.  Pruebas de diagnstico por imgenes, como: ? Radiografas. ? Resonancia magntica (RM). TRATAMIENTO El tratamiento de esta afeccin puede incluir lo siguiente:  Corporate treasurerAplicar calor y fro en la zona afectada.  Tomar medicamentos para Engineer, materialsaliviar el dolor y International aid/development workerrelajar los msculos (relajantes musculares).  Tomar antiinflamatorios no esteroides Murriel Hopper(AINE) para ayudar a Building services engineerreducir la hinchazn y las molestias.  Realizar fisioterapia. Cuando los sntomas mejoran, es importante volver a su rutina habitual tan pronto como sea posible para reducir Chief Technology Officerel dolor, y Automotive engineerevitar la rigidez y la prdida de fuerza muscular. En general, los sntomas deberan mejorar en 6semanas de tratamiento. Sin embargo, el tiempo de recuperacin vara. INSTRUCCIONES PARA EL CUIDADO EN EL HOGAR Control del dolor, de la rigidez y de la hinchazn  Si se lo indic el mdico, aplique hielo en la zona afectada durante las primeras 24horas despus de la lesin. ? Ponga el hielo en una bolsa plstica. ? Coloque una FirstEnergy Corptoalla entre la piel y la bolsa de hielo. ? Coloque el hielo durante 20minutos, 2 a 3veces por da.  Si se lo indican, aplique calor en la zona afectada tan frecuentemente como se lo haya indicado el mdico. Use la fuente de calor que el mdico le recomiende, como una compresa de calor hmedo o una almohadilla  trmica. ? Coloque una FirstEnergy Corptoalla entre la piel y la fuente de Airline pilotcalor. ? Aplique el calor durante 20 a 30minutos. ? Retire la fuente de calor si la piel se le pone de color rojo brillante. Esto es muy importante si no puede sentir el dolor, el calor o el fro. Puede correr un riesgo mayor de sufrir quemaduras. Actividad  Descanse y retome sus actividades normales como se lo haya indicado el mdico. Pregntele al mdico qu actividades son seguras para usted.  Evite las actividades que demandan mucho esfuerzo (que son extenuantes) durante el  tiempo que le haya indicado el mdico.  Haga ejercicios como se lo haya indicado el mdico. Instrucciones generales  [employer] medicamentos de venta libre y los recetados solamente como se lo haya indicado el mdico.  Si tiene alguna pregunta o inquietud sobre la seguridad mientras toma analgsicos, hable con el mdico.  No conduzca ni opere maquinaria pesada hasta saber cmo lo afectan los analgsicos.  No consuma ningn producto que contenga tabaco, lo que incluye cigarrillos, tabaco de Theatre manager y Administrator, Civil Service. El tabaco puede retrasar el proceso de curacin. Si necesita ayuda para dejar de fumar, consulte al mdico.  Concurra a todas las visitas de control como se lo haya indicado el mdico. Esto es importante. PREVENCIN  Precaliente y elongue adecuadamente antes de la [place].  Reljese y elongue despus de realizar [place].  Dele a su cuerpo tiempo para [employer] perodos de [place].  Evite lo siguiente: ? Estar fsicamente inactivo durante perodos prolongados. ? Hacer ejercicio o practicar deportes cuando est cansado o dolorido.  Practique deportes y levante objetos pesados de la forma correcta.  Adopte una buena postura mientras est sentado y de pie.  Mantenga un peso saludable.  Duerma en un colchn de firmeza media para apoyar la columna.  Asegrese de Chemical engineer un equipo apto para usted, incluidos  zapatos que [employer].  Tome medidas de seguridad y sea responsable al hacer Ether Griffins, para evitar las cadas.  Haga por lo menos de ejercicios de intensidad moderada cada semana, como caminar a paso ligero o hacer gimnasia acutica. Pruebe alguna forma de ejercicio que le quite tensin de la espalda, como nadar o Chemical engineer la bicicleta fija.  Mantenga un buen estado fsico, esto incluye lo siguiente: ? La fuerza. ? La flexibilidad. ? La capacidad cardiovascular. ? La resistencia.  SOLICITE ATENCIN MDICA SI:  El dolor de espalda no mejora despus de 6semanas de [place].  Los sntomas empeoran.  SOLICITE ATENCIN MDICA DE INMEDIATO SI:  El dolor de espalda es muy intenso.  Nota que no puede pararse o caminar.  Siente dolor en las piernas.  Siente debilidad en las nalgas o en las piernas.  Tiene problemas para Scientist, physiological miccin o los movimientos intestinales.  Esta informacin no tiene Theme park manager el consejo del mdico. Asegrese de hacerle al mdico cualquier pregunta que tenga. Document Released: 03/17/2006 Document Revised: 10/15/2014 Document Reviewed: 03/12/2015 Elsevier Interactive Patient Education  2017 Elsevier Inc.     Edwina Barth, MD [facility]

## 2020-12-16 ENCOUNTER — Ambulatory Visit
Admission: RE | Admit: 2020-12-16 | Discharge: 2020-12-16 | Disposition: A | Discharge status: Home / Self Care | Payer: Self-pay | Source: Ambulatory Visit | Attending: Chiropractic Medicine | Admitting: Chiropractic Medicine

## 2020-12-16 ENCOUNTER — Other Ambulatory Visit: Payer: Self-pay | Admitting: Surgery

## 2020-12-16 ENCOUNTER — Other Ambulatory Visit: Payer: Self-pay | Admitting: Chiropractic Medicine

## 2020-12-16 DIAGNOSIS — M545 Low back pain, unspecified: Secondary | ICD-10-CM

## 2021-11-07 IMAGING — CR DG THORACIC SPINE 3V
3 series · 3 of 3 positions shown · non-contrast
Comparison: None.

CLINICAL DATA: Back pain radiating down left leg, fall at work

EXAM:
THORACIC SPINE - 3 VIEWS

[w thoracic spine ap]
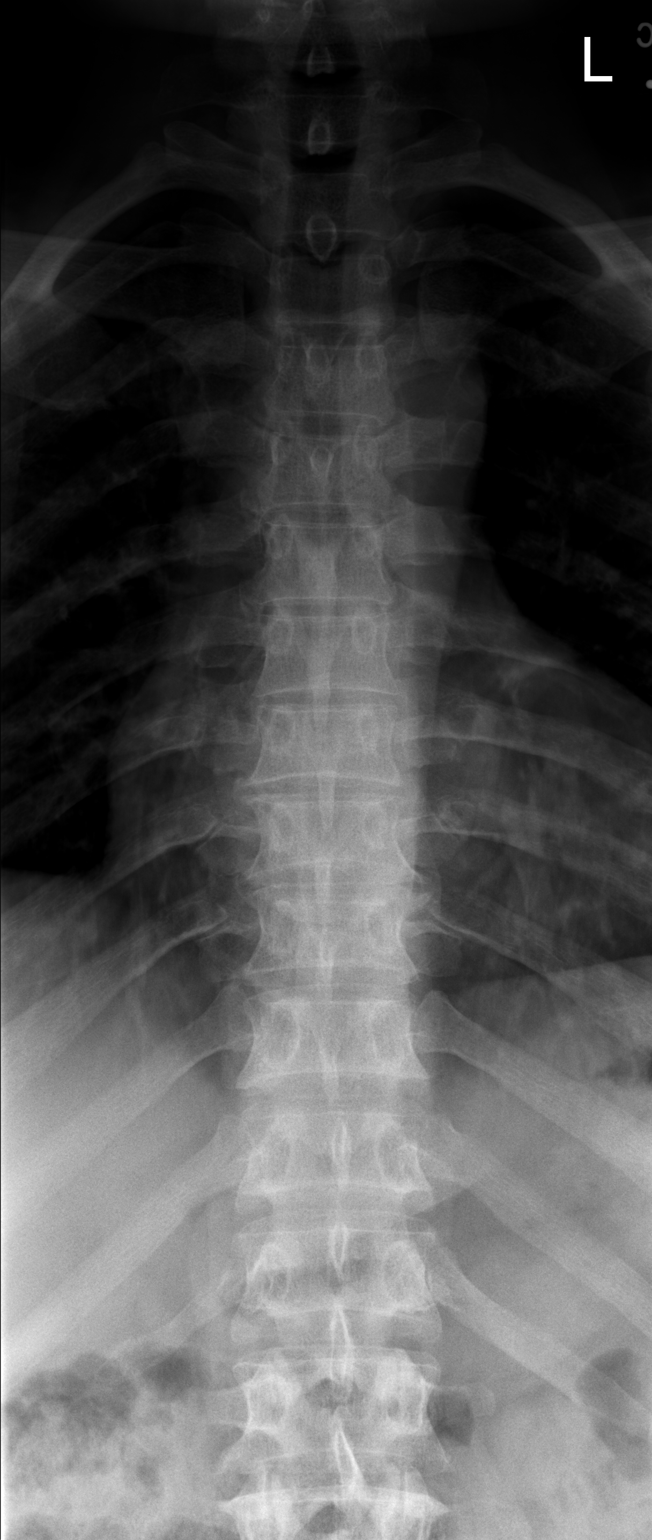

[w thoracic spine lat]
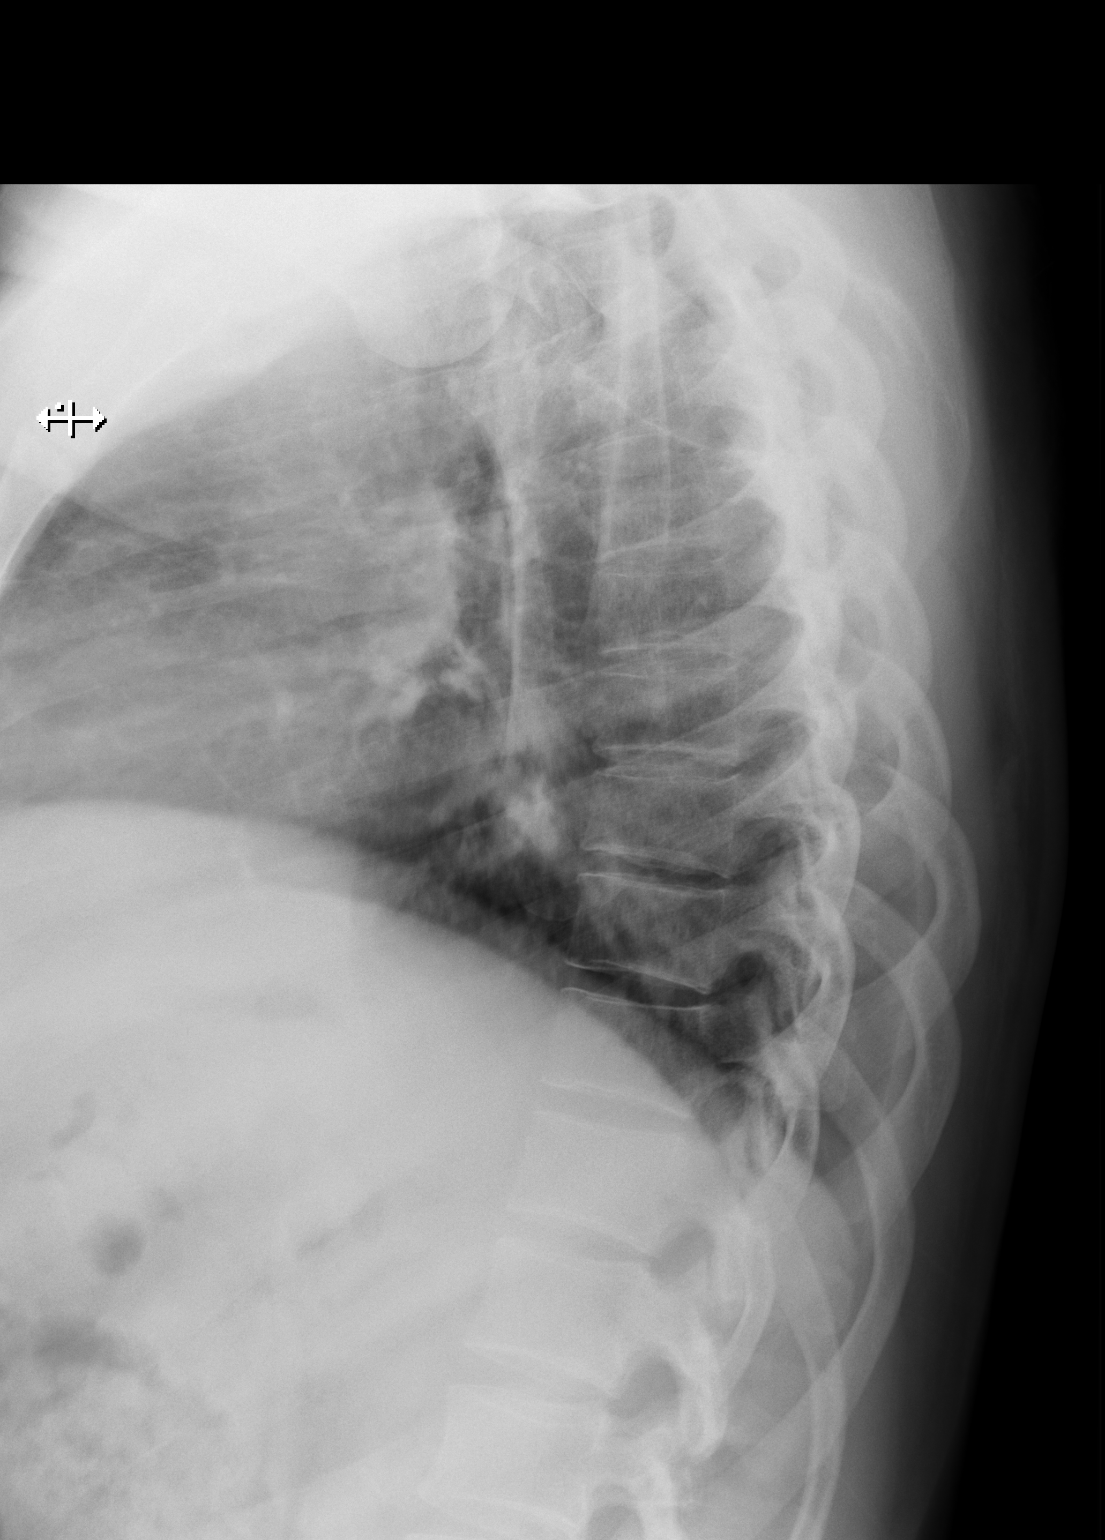

[w thoracic swimmers]
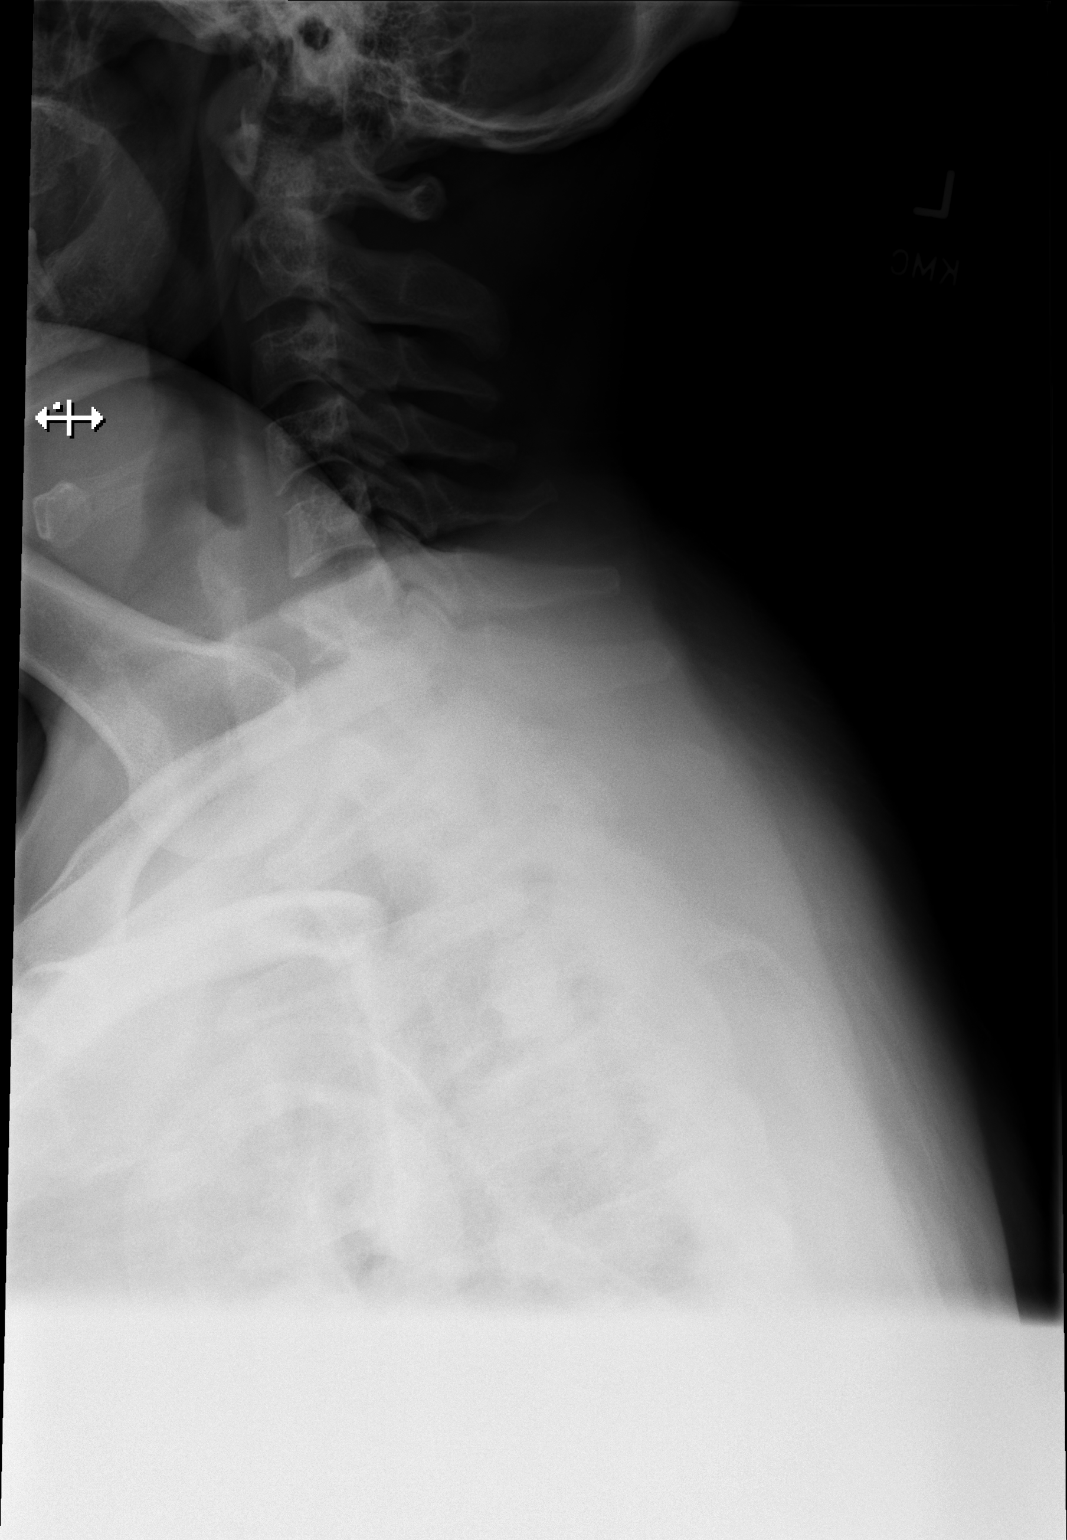

[3 of 3 positions shown; findings below may reference images not displayed]

FINDINGS: There is no evidence of thoracic spine fracture. Alignment is
normal. No other significant bone abnormalities are identified.
IMPRESSION: No fracture or dislocation of the thoracic spine. Disc spaces and
vertebral body heights are preserved.
